# Patient Record
Sex: Female | Born: 1985 | Race: Black or African American | Hispanic: No | Marital: Single | Smoking: Former smoker
Health system: Southern US, Community
[De-identification: ages and names within clinical notes are randomized; demographics above are authoritative.]

## PROBLEM LIST (undated history)

## (undated) DIAGNOSIS — F32A Depression, unspecified: Secondary | ICD-10-CM

## (undated) DIAGNOSIS — F419 Anxiety disorder, unspecified: Secondary | ICD-10-CM

## (undated) DIAGNOSIS — D649 Anemia, unspecified: Secondary | ICD-10-CM

## (undated) DIAGNOSIS — Z973 Presence of spectacles and contact lenses: Secondary | ICD-10-CM

## (undated) DIAGNOSIS — K219 Gastro-esophageal reflux disease without esophagitis: Secondary | ICD-10-CM

## (undated) DIAGNOSIS — R430 Anosmia: Secondary | ICD-10-CM

## (undated) HISTORY — DX: Anosmia: R43.0

---

## 2013-05-20 ENCOUNTER — Emergency Department (HOSPITAL_BASED_OUTPATIENT_CLINIC_OR_DEPARTMENT_OTHER): Payer: 59

## 2013-05-20 ENCOUNTER — Encounter (HOSPITAL_BASED_OUTPATIENT_CLINIC_OR_DEPARTMENT_OTHER): Payer: Self-pay | Admitting: Emergency Medicine

## 2013-05-20 ENCOUNTER — Emergency Department (HOSPITAL_BASED_OUTPATIENT_CLINIC_OR_DEPARTMENT_OTHER)
Admission: EM | Admit: 2013-05-20 | Discharge: 2013-05-20 | Disposition: A | Discharge status: Home / Self Care | Payer: 59 | Attending: Emergency Medicine | Admitting: Emergency Medicine

## 2013-05-20 DIAGNOSIS — R102 Pelvic and perineal pain: Secondary | ICD-10-CM

## 2013-05-20 DIAGNOSIS — Z3202 Encounter for pregnancy test, result negative: Secondary | ICD-10-CM | POA: Insufficient documentation

## 2013-05-20 DIAGNOSIS — R109 Unspecified abdominal pain: Secondary | ICD-10-CM | POA: Insufficient documentation

## 2013-05-20 DIAGNOSIS — R110 Nausea: Secondary | ICD-10-CM | POA: Insufficient documentation

## 2013-05-20 LAB — URINALYSIS, ROUTINE W REFLEX MICROSCOPIC
BILIRUBIN URINE: NEGATIVE
Glucose, UA: NEGATIVE mg/dL
HGB URINE DIPSTICK: NEGATIVE
Ketones, ur: 15 mg/dL — AB
Leukocytes, UA: NEGATIVE
Nitrite: NEGATIVE
PH: 7.5 (ref 5.0–8.0)
Protein, ur: NEGATIVE mg/dL
Specific Gravity, Urine: 1.016 (ref 1.005–1.030)
UROBILINOGEN UA: 0.2 mg/dL (ref 0.0–1.0)

## 2013-05-20 LAB — COMPREHENSIVE METABOLIC PANEL
ALT: 8 U/L (ref 0–35)
AST: 14 U/L (ref 0–37)
Albumin: 4.1 g/dL (ref 3.5–5.2)
Alkaline Phosphatase: 57 U/L (ref 39–117)
BILIRUBIN TOTAL: 0.4 mg/dL (ref 0.3–1.2)
BUN: 8 mg/dL (ref 6–23)
CHLORIDE: 99 meq/L (ref 96–112)
CO2: 29 mEq/L (ref 19–32)
Calcium: 9.8 mg/dL (ref 8.4–10.5)
Creatinine, Ser: 0.8 mg/dL (ref 0.50–1.10)
Glucose, Bld: 95 mg/dL (ref 70–99)
Potassium: 4 mEq/L (ref 3.7–5.3)
SODIUM: 138 meq/L (ref 137–147)
Total Protein: 7.6 g/dL (ref 6.0–8.3)

## 2013-05-20 LAB — CBC WITH DIFFERENTIAL/PLATELET
BASOS ABS: 0 10*3/uL (ref 0.0–0.1)
Basophils Relative: 0 % (ref 0–1)
Eosinophils Absolute: 0.4 10*3/uL (ref 0.0–0.7)
Eosinophils Relative: 4 % (ref 0–5)
HEMATOCRIT: 41.7 % (ref 36.0–46.0)
Hemoglobin: 13.4 g/dL (ref 12.0–15.0)
LYMPHS PCT: 33 % (ref 12–46)
Lymphs Abs: 3.4 10*3/uL (ref 0.7–4.0)
MCH: 25.4 pg — ABNORMAL LOW (ref 26.0–34.0)
MCHC: 32.1 g/dL (ref 30.0–36.0)
MCV: 79.1 fL (ref 78.0–100.0)
MONO ABS: 0.7 10*3/uL (ref 0.1–1.0)
Monocytes Relative: 7 % (ref 3–12)
NEUTROS ABS: 5.7 10*3/uL (ref 1.7–7.7)
Neutrophils Relative %: 56 % (ref 43–77)
PLATELETS: 263 10*3/uL (ref 150–400)
RBC: 5.27 MIL/uL — ABNORMAL HIGH (ref 3.87–5.11)
RDW: 13.6 % (ref 11.5–15.5)
WBC: 10.2 10*3/uL (ref 4.0–10.5)

## 2013-05-20 LAB — PREGNANCY, URINE: PREG TEST UR: NEGATIVE

## 2013-05-20 LAB — WET PREP, GENITAL
Trich, Wet Prep: NONE SEEN
Yeast Wet Prep HPF POC: NONE SEEN

## 2013-05-20 MED ORDER — KETOROLAC TROMETHAMINE 30 MG/ML IJ SOLN
30.0000 mg | Freq: Once | INTRAMUSCULAR | Status: AC
  Administered 2013-05-20: 30 mg via INTRAVENOUS
  Filled 2013-05-20: qty 1

## 2013-05-20 MED ORDER — HYDROCODONE-ACETAMINOPHEN 5-325 MG PO TABS: 1.0000 | ORAL_TABLET | ORAL | Status: DC | PRN

## 2013-05-20 NOTE — ED Notes (Signed)
Pt c/o generalized abd pain with nausea only

## 2013-05-20 NOTE — Discharge Instructions (Signed)
Hydrocodone as prescribed as needed for pain.  Return to the emergency department if your symptoms worsen, or you develop vaginal bleeding, high fever, bloody stool, or any other new or concerning symptoms.   Abdominal Pain, Women Abdominal (stomach, pelvic, or belly) pain can be caused by many things. It is important to tell your doctor:  The location of the pain.  Does it come and go or is it present all the time?  Are there things that start the pain (eating certain foods, exercise)?  Are there other symptoms associated with the pain (fever, nausea, vomiting, diarrhea)? All of this is helpful to know when trying to find the cause of the pain. CAUSES   Stomach: virus or bacteria infection, or ulcer.  Intestine: appendicitis (inflamed appendix), regional ileitis (Crohn's disease), ulcerative colitis (inflamed colon), irritable bowel syndrome, diverticulitis (inflamed diverticulum of the colon), or cancer of the stomach or intestine.  Gallbladder disease or stones in the gallbladder.  Kidney disease, kidney stones, or infection.  Pancreas infection or cancer.  Fibromyalgia (pain disorder).  Diseases of the female organs:  Uterus: fibroid (non-cancerous) tumors or infection.  Fallopian tubes: infection or tubal pregnancy.  Ovary: cysts or tumors.  Pelvic adhesions (scar tissue).  Endometriosis (uterus lining tissue growing in the pelvis and on the pelvic organs).  Pelvic congestion syndrome (female organs filling up with blood just before the menstrual period).  Pain with the menstrual period.  Pain with ovulation (producing an egg).  Pain with an IUD (intrauterine device, birth control) in the uterus.  Cancer of the female organs.  Functional pain (pain not caused by a disease, may improve without treatment).  Psychological pain.  Depression. DIAGNOSIS  Your doctor will decide the seriousness of your pain by doing an examination.  Blood  tests.  X-rays.  Ultrasound.  CT scan (computed tomography, special type of X-ray).  MRI (magnetic resonance imaging).  Cultures, for infection.  Barium enema (dye inserted in the large intestine, to better view it with X-rays).  Colonoscopy (looking in intestine with a lighted tube).  Laparoscopy (minor surgery, looking in abdomen with a lighted tube).  Major abdominal exploratory surgery (looking in abdomen with a large incision). TREATMENT  The treatment will depend on the cause of the pain.   Many cases can be observed and treated at home.  Over-the-counter medicines recommended by your caregiver.  Prescription medicine.  Antibiotics, for infection.  Birth control pills, for painful periods or for ovulation pain.  Hormone treatment, for endometriosis.  Nerve blocking injections.  Physical therapy.  Antidepressants.  Counseling with a psychologist or psychiatrist.  Minor or major surgery. HOME CARE INSTRUCTIONS   Do not take laxatives, unless directed by your caregiver.  Take over-the-counter pain medicine only if ordered by your caregiver. Do not take aspirin because it can cause an upset stomach or bleeding.  Try a clear liquid diet (broth or water) as ordered by your caregiver. Slowly move to a bland diet, as tolerated, if the pain is related to the stomach or intestine.  Have a thermometer and take your temperature several times a day, and record it.  Bed rest and sleep, if it helps the pain.  Avoid sexual intercourse, if it causes pain.  Avoid stressful situations.  Keep your follow-up appointments and tests, as your caregiver orders.  If the pain does not go away with medicine or surgery, you may try:  Acupuncture.  Relaxation exercises (yoga, meditation).  Group therapy.  Counseling. SEEK MEDICAL CARE IF:  You notice certain foods cause stomach pain.  Your home care treatment is not helping your pain.  You need stronger pain  medicine.  You want your IUD removed.  You feel faint or lightheaded.  You develop nausea and vomiting.  You develop a rash.  You are having side effects or an allergy to your medicine. SEEK IMMEDIATE MEDICAL CARE IF:   Your pain does not go away or gets worse.  You have a fever.  Your pain is felt only in portions of the abdomen. The right side could possibly be appendicitis. The left lower portion of the abdomen could be colitis or diverticulitis.  You are passing blood in your stools (bright red or black tarry stools, with or without vomiting).  You have blood in your urine.  You develop chills, with or without a fever.  You pass out. MAKE SURE YOU:   Understand these instructions.  Will watch your condition.  Will get help right away if you are not doing well or get worse. Document Released: 11/05/2006 Document Revised: 04/02/2011 Document Reviewed: 11/25/2008 [facility] Patient Information 2014 [place], [place].

## 2013-05-20 NOTE — ED Provider Notes (Signed)
CSN: [id number]     Arrival date & time 05/20/13  1600 History   First MD Initiated Contact with Patient 05/20/13 1614     Chief Complaint  Patient presents with  . Abdominal Pain     (Consider location/radiation/quality/duration/timing/severity/associated sxs/prior Treatment) HPI Comments: Patient is a 28 year old female who presents with complaints of suprapubic abdominal pain and nausea for the past 2 days. She denies any bowel or bladder complaints. She denies any vaginal bleeding or discharge. She denies any fevers or chills. Her discomfort has been constant and is worse with palpation and movement.  Patient is a 28 y.o. female presenting with abdominal pain. The history is provided by the patient.  Abdominal Pain Pain location:  Suprapubic Pain quality: cramping   Pain radiates to:  Does not radiate Pain severity:  Moderate Onset quality:  Gradual Duration:  2 days Timing:  Constant Progression:  Worsening Chronicity:  New Relieved by:  Nothing Worsened by:  Nothing tried Ineffective treatments:  None tried   History reviewed. No pertinent past medical history. History reviewed. No pertinent past surgical history. History reviewed. No pertinent family history. History  Substance Use Topics  . Smoking status: Never Smoker   . Smokeless tobacco: Not on file  . Alcohol Use: No   OB History   Grav Para Term Preterm Abortions TAB SAB Ect Mult Living                 Review of Systems  Gastrointestinal: Positive for abdominal pain.  All other systems reviewed and are negative.     Allergies  Review of patient's allergies indicates no known allergies.  Home Medications   Prior to Admission medications   Medication Sig Start Date End Date Taking? Authorizing Provider  ibuprofen (ADVIL,MOTRIN) 800 MG tablet Take 800 mg by mouth every 8 (eight) hours as needed.   Yes Historical Provider, MD   BP 134/82  Pulse 83  Temp(Src) 97.8 F (36.6 C) (Oral)  Resp 16  Ht  5\' 4"  (1.626 m)  Wt 165 lb (74.844 kg)  BMI 28.31 kg/m2  SpO2 100%  LMP 04/20/2013 Physical Exam  Nursing note and vitals reviewed. Constitutional: She is oriented to person, place, and time. She appears well-developed and well-nourished. No distress.  HENT:  Head: Normocephalic and atraumatic.  Neck: Normal range of motion. Neck supple.  Cardiovascular: Normal rate and regular rhythm.  Exam reveals no gallop and no friction rub.   No murmur heard. Pulmonary/Chest: Effort normal and breath sounds normal. No respiratory distress. She has no wheezes.  Abdominal: Soft. Bowel sounds are normal. She exhibits no distension. There is tenderness.  There is tenderness to palpation in the suprapubic region. There is no rebound and no guarding.  Musculoskeletal: Normal range of motion.  Neurological: She is alert and oriented to person, place, and time.  Skin: Skin is warm and dry. She is not diaphoretic.    ED Course  Procedures (including critical care time) Labs Review Labs Reviewed  URINALYSIS, ROUTINE W REFLEX MICROSCOPIC  PREGNANCY, URINE    Imaging Review No results found.   EKG Interpretation None      MDM   Final diagnoses:  None    Patient is a 28 year old female who presents with complaints of suprapubic discomfort. This is been going on for the past 12 hours. She denies any injury or trauma. She is having no bowel or bladder complaints and no fever. She denies any vaginal bleeding or discharge. Workup today reveals no  elevation of white count and there is no right lower quadrant tenderness to palpation I strongly doubt appendicitis. Her pregnancy test is negative, thus ruling out an ectopic pregnancy. Pelvic examination is essentially unremarkable and wet prep reveals no evidence for infection. This was followed up with an ultrasound of the pelvis which revealed no acute pelvic abnormality. There is no evidence for ovarian cyst or torsion. At this point, I feel as though  I've excluded any emergent pathology and feel as though symptomatic treatment and time are the appropriate treatment modality. She understands to return if she develops any problems.    Veryl Speak, MD 05/20/13 2108

## 2013-05-21 LAB — GC/CHLAMYDIA PROBE AMP
CT Probe RNA: NEGATIVE
GC PROBE AMP APTIMA: NEGATIVE

## 2013-05-21 LAB — HIV ANTIBODY (ROUTINE TESTING W REFLEX): HIV 1&2 Ab, 4th Generation: NONREACTIVE

## 2013-06-01 ENCOUNTER — Emergency Department (HOSPITAL_BASED_OUTPATIENT_CLINIC_OR_DEPARTMENT_OTHER): Payer: Medicaid - Out of State

## 2013-06-01 ENCOUNTER — Emergency Department (HOSPITAL_BASED_OUTPATIENT_CLINIC_OR_DEPARTMENT_OTHER)
Admission: EM | Admit: 2013-06-01 | Discharge: 2013-06-01 | Disposition: A | Discharge status: Home / Self Care | Payer: Medicaid - Out of State | Attending: Emergency Medicine | Admitting: Emergency Medicine

## 2013-06-01 ENCOUNTER — Encounter (HOSPITAL_BASED_OUTPATIENT_CLINIC_OR_DEPARTMENT_OTHER): Payer: Self-pay | Admitting: Emergency Medicine

## 2013-06-01 DIAGNOSIS — M546 Pain in thoracic spine: Secondary | ICD-10-CM | POA: Insufficient documentation

## 2013-06-01 DIAGNOSIS — M549 Dorsalgia, unspecified: Secondary | ICD-10-CM

## 2013-06-01 DIAGNOSIS — R300 Dysuria: Secondary | ICD-10-CM | POA: Insufficient documentation

## 2013-06-01 MED ORDER — KETOROLAC TROMETHAMINE 60 MG/2ML IM SOLN
60.0000 mg | Freq: Once | INTRAMUSCULAR | Status: AC
  Administered 2013-06-01: 60 mg via INTRAMUSCULAR
  Filled 2013-06-01: qty 2

## 2013-06-01 MED ORDER — HYDROMORPHONE HCL PF 2 MG/ML IJ SOLN
2.0000 mg | Freq: Once | INTRAMUSCULAR | Status: AC
  Administered 2013-06-01: 2 mg via INTRAMUSCULAR
  Filled 2013-06-01: qty 1

## 2013-06-01 MED ORDER — IBUPROFEN 800 MG PO TABS: 800.0000 mg | ORAL_TABLET | Freq: Three times a day (TID) | ORAL | Status: DC | PRN

## 2013-06-01 MED ORDER — HYDROCODONE-ACETAMINOPHEN 5-325 MG PO TABS: 1.0000 | ORAL_TABLET | ORAL | Status: DC | PRN

## 2013-06-01 NOTE — Discharge Instructions (Signed)
Back Pain, Adult Low back pain is very common. About 1 in 5 people have back pain.The cause of low back pain is rarely dangerous. The pain often gets better over time.About half of people with a sudden onset of back pain feel better in just 2 weeks. About 8 in 10 people feel better by 6 weeks.  CAUSES Some common causes of back pain include:  Strain of the muscles or ligaments supporting the spine.  Wear and tear (degeneration) of the spinal discs.  Arthritis.  Direct injury to the back. DIAGNOSIS Most of the time, the direct cause of low back pain is not known.However, back pain can be treated effectively even when the exact cause of the pain is unknown.Answering your caregiver's questions about your overall health and symptoms is one of the most accurate ways to make sure the cause of your pain is not dangerous. If your caregiver needs more information, he or she may order lab work or imaging tests (X-rays or MRIs).However, even if imaging tests show changes in your back, this usually does not require surgery. HOME CARE INSTRUCTIONS For many people, back pain returns.Since low back pain is rarely dangerous, it is often a condition that people can learn to manageon their own.   Remain active. It is stressful on the back to sit or stand in one place. Do not sit, drive, or stand in one place for more than 30 minutes at a time. Take short walks on level surfaces as soon as pain allows.Try to increase the length of time you walk each day.  Do not stay in bed.Resting more than 1 or 2 days can delay your recovery.  Do not avoid exercise or work.Your body is made to move.It is not dangerous to be active, even though your back may hurt.Your back will likely heal faster if you return to being active before your pain is gone.  Pay attention to your body when you bend and lift. Many people have less discomfortwhen lifting if they bend their knees, keep the load close to their bodies,and  avoid twisting. Often, the most comfortable positions are those that put less stress on your recovering back.  Find a comfortable position to sleep. Use a firm mattress and lie on your side with your knees slightly bent. If you lie on your back, put a pillow under your knees.  Only take over-the-counter or prescription medicines as directed by your caregiver. Over-the-counter medicines to reduce pain and inflammation are often the most helpful.Your caregiver may prescribe muscle relaxant drugs.These medicines help dull your pain so you can more quickly return to your normal activities and healthy exercise.  Put ice on the injured area.  Put ice in a plastic bag.  Place a towel between your skin and the bag.  Leave the ice on for 15-20 minutes, 03-04 times a day for the first 2 to 3 days. After that, ice and heat may be alternated to reduce pain and spasms.  Ask your caregiver about trying back exercises and gentle massage. This may be of some benefit.  Avoid feeling anxious or stressed.Stress increases muscle tension and can worsen back pain.It is important to recognize when you are anxious or stressed and learn ways to manage it.Exercise is a great option. SEEK MEDICAL CARE IF:  You have pain that is not relieved with rest or medicine.  You have pain that does not improve in 1 week.  You have new symptoms.  You are generally not feeling well. SEEK   IMMEDIATE MEDICAL CARE IF:   You have pain that radiates from your back into your legs.  You develop new bowel or bladder control problems.  You have unusual weakness or numbness in your arms or legs.  You develop nausea or vomiting.  You develop abdominal pain.  You feel faint. Document Released: 01/08/2005 Document Revised: 07/10/2011 Document Reviewed: 05/29/2010 [employer] Patient Information 2014 [employer].  

## 2013-06-01 NOTE — ED Provider Notes (Signed)
Medical screening examination/treatment/procedure(s) were performed by non-physician practitioner and as supervising physician I was immediately available for consultation/collaboration.   EKG Interpretation None        Blanchie Dessert, MD 06/01/13 [phone]

## 2013-06-01 NOTE — ED Provider Notes (Signed)
CSN: [id number]     Arrival date & time 06/01/13  1153 History   First MD Initiated Contact with Patient 06/01/13 1312     Chief Complaint  Patient presents with  . Back Pain     (Consider location/radiation/quality/duration/timing/severity/associated sxs/prior Treatment) Patient is a 28 y.o. female presenting with back pain. The history is provided by the patient. No language interpreter was used.  Back Pain Location:  Thoracic spine Quality:  Aching Radiates to:  Does not radiate Pain severity:  Moderate Pain is:  Same all the time Onset quality:  Gradual Duration:  1 day Timing:  Constant Progression:  Worsening Chronicity:  New Context: not recent illness and not recent injury   Relieved by:  Nothing Worsened by:  Nothing tried Ineffective treatments:  None tried Associated symptoms: dysuria   Associated symptoms: no fever, no headaches and no numbness   Risk factors: not pregnant and no recent surgery     History reviewed. No pertinent past medical history. History reviewed. No pertinent past surgical history. History reviewed. No pertinent family history. History  Substance Use Topics  . Smoking status: Never Smoker   . Smokeless tobacco: Not on file  . Alcohol Use: No   OB History   Grav Para Term Preterm Abortions TAB SAB Ect Mult Living                 Review of Systems  Constitutional: Negative for fever.  Genitourinary: Positive for dysuria.  Musculoskeletal: Positive for back pain.  Neurological: Negative for numbness and headaches.  All other systems reviewed and are negative.     Allergies  Review of patient's allergies indicates no known allergies.  Home Medications   Prior to Admission medications   Medication Sig Start Date End Date Taking? Authorizing Provider  HYDROcodone-acetaminophen (NORCO) 5-325 MG per tablet Take 1-2 tablets by mouth every 4 (four) hours as needed. 05/20/13   Veryl Speak, MD  ibuprofen (ADVIL,MOTRIN) 800 MG tablet  Take 800 mg by mouth every 8 (eight) hours as needed.    Historical Provider, MD   BP 125/89  Pulse 112  Temp(Src) 98.8 F (37.1 C) (Oral)  Resp 16  Wt 165 lb (74.844 kg)  SpO2 100%  LMP 04/20/2013 Physical Exam  Nursing note and vitals reviewed. Constitutional: She is oriented to person, place, and time. She appears well-developed and well-nourished.  HENT:  Head: Normocephalic and atraumatic.  Eyes: Conjunctivae and EOM are normal. Pupils are equal, round, and reactive to light.  Neck: Normal range of motion. Neck supple.  Cardiovascular: Normal rate.   Pulmonary/Chest: Effort normal.  Abdominal: Soft.  Musculoskeletal: Normal range of motion.  Neurological: She is alert and oriented to person, place, and time. She has normal reflexes.  Skin: Skin is warm.  Psychiatric: She has a normal mood and affect.    ED Course  Procedures (including critical care time) Labs Review Labs Reviewed - No data to display  Imaging Review Dg Chest 2 View  06/01/2013   CLINICAL DATA:  Back pain.  EXAM: CHEST  2 VIEW  COMPARISON:  None.  FINDINGS: The heart size and mediastinal contours are within normal limits. Both lungs are clear. No pneumothorax or pleural effusion is noted. The visualized skeletal structures are unremarkable.  IMPRESSION: No acute cardiopulmonary abnormality seen.   Electronically Signed   By: Sabino Dick M.D.   On: 06/01/2013 13:10     EKG Interpretation None      MDM   Final diagnoses:  None    Hydrocodone Ibuprofen Schedule to see Dr. Barbaraann Barthel for recheck    Fransico Meadow, PA-C 06/01/13 1404

## 2013-06-01 NOTE — ED Notes (Signed)
Middle back pain since yesterday afternoon. Denies injury. States now she is feeling some SOB.

## 2014-03-28 ENCOUNTER — Other Ambulatory Visit: Payer: Self-pay | Admitting: Emergency Medicine

## 2014-03-28 ENCOUNTER — Emergency Department (HOSPITAL_BASED_OUTPATIENT_CLINIC_OR_DEPARTMENT_OTHER)
Admission: EM | Admit: 2014-03-28 | Discharge: 2014-03-28 | Disposition: A | Discharge status: Home / Self Care | Payer: Medicaid - Out of State | Attending: Emergency Medicine | Admitting: Emergency Medicine

## 2014-03-28 ENCOUNTER — Encounter (HOSPITAL_BASED_OUTPATIENT_CLINIC_OR_DEPARTMENT_OTHER): Payer: Self-pay

## 2014-03-28 DIAGNOSIS — J029 Acute pharyngitis, unspecified: Secondary | ICD-10-CM

## 2014-03-28 LAB — RAPID STREP SCREEN (MED CTR MEBANE ONLY): STREPTOCOCCUS, GROUP A SCREEN (DIRECT): NEGATIVE

## 2014-03-28 NOTE — Discharge Instructions (Signed)
Ibuprofen 600 mg every 6 hours as needed for pain.  We will call you if your cultures indicate you require further treatment.  Return to the ER if your symptoms substantially worsen or change.   Viral Infections A viral infection can be caused by different types of viruses.Most viral infections are not serious and resolve on their own. However, some infections may cause severe symptoms and may lead to further complications. SYMPTOMS Viruses can frequently cause:  Minor sore throat.  Aches and pains.  Headaches.  Runny nose.  Different types of rashes.  Watery eyes.  Tiredness.  Cough.  Loss of appetite.  Gastrointestinal infections, resulting in nausea, vomiting, and diarrhea. These symptoms do not respond to antibiotics because the infection is not caused by bacteria. However, you might catch a bacterial infection following the viral infection. This is sometimes called a "superinfection." Symptoms of such a bacterial infection may include:  Worsening sore throat with pus and difficulty swallowing.  Swollen neck glands.  Chills and a high or persistent fever.  Severe headache.  Tenderness over the sinuses.  Persistent overall ill feeling (malaise), muscle aches, and tiredness (fatigue).  Persistent cough.  Yellow, green, or brown mucus production with coughing. HOME CARE INSTRUCTIONS   Only take over-the-counter or prescription medicines for pain, discomfort, diarrhea, or fever as directed by your caregiver.  Drink enough water and fluids to keep your urine clear or pale yellow. Sports drinks can provide valuable electrolytes, sugars, and hydration.  Get plenty of rest and maintain proper nutrition. Soups and broths with crackers or rice are fine. SEEK IMMEDIATE MEDICAL CARE IF:   You have severe headaches, shortness of breath, chest pain, neck pain, or an unusual rash.  You have uncontrolled vomiting, diarrhea, or you are unable to keep down fluids.  You  or your child has an oral temperature above 102 F (38.9 C), not controlled by medicine.  Your baby is older than 3 months with a rectal temperature of 102 F (38.9 C) or higher.  Your baby is 19 months old or younger with a rectal temperature of 100.4 F (38 C) or higher. MAKE SURE YOU:   Understand these instructions.  Will watch your condition.  Will get help right away if you are not doing well or get worse. Document Released: 10/18/2004 Document Revised: 04/02/2011 Document Reviewed: 05/15/2010 [facility] Patient Information 2015 [place], [place]. This information is not intended to replace advice given to you by your health care provider. Make sure you discuss any questions you have with your health care provider.

## 2014-03-28 NOTE — ED Notes (Signed)
Patient here with sore throat and fever x 2 days.

## 2014-03-29 NOTE — ED Provider Notes (Signed)
CSN: [id number]     Arrival date & time 03/28/14  1028 History   First MD Initiated Contact with Patient 03/28/14 1104     Chief Complaint  Patient presents with  . Sore Throat     (Consider location/radiation/quality/duration/timing/severity/associated sxs/prior Treatment) HPI Comments: Patient presents with a 2 day history of sore throat. She denies fevers. She denies difficulty breathing or swallowing.  Patient is a 29 y.o. female presenting with pharyngitis. The history is provided by the patient.  Sore Throat This is a new problem. The current episode started 2 days ago. The problem occurs constantly. The problem has been gradually worsening. Pertinent negatives include no chest pain and no abdominal pain. The symptoms are aggravated by swallowing and eating. Nothing relieves the symptoms. She has tried nothing for the symptoms. The treatment provided no relief.    History reviewed. No pertinent past medical history. History reviewed. No pertinent past surgical history. No family history on file. History  Substance Use Topics  . Smoking status: Never Smoker   . Smokeless tobacco: Not on file  . Alcohol Use: No   OB History    No data available     Review of Systems  Cardiovascular: Negative for chest pain.  Gastrointestinal: Negative for abdominal pain.  All other systems reviewed and are negative.     Allergies  Review of patient's allergies indicates no known allergies.  Home Medications   Prior to Admission medications   Medication Sig Start Date End Date Taking? Authorizing Provider  ibuprofen (ADVIL,MOTRIN) 800 MG tablet Take 1 tablet (800 mg total) by mouth every 8 (eight) hours as needed. 06/01/13   Fransico Meadow, PA-C   BP 137/89 mmHg  Pulse 90  Temp(Src) 98.2 F (36.8 C) (Oral)  Resp 18  Ht 5\' 5"  (1.651 m)  Wt 157 lb (71.215 kg)  BMI 26.13 kg/m2  SpO2 100%  LMP 03/23/2014 Physical Exam  Constitutional: She is oriented to person, place, and time.  She appears well-developed and well-nourished. No distress.  HENT:  Head: Normocephalic and atraumatic.  Mouth/Throat: Oropharynx is clear and moist.  There is mild erythema of the posterior oropharynx with no exudates present.  Neck: Normal range of motion. Neck supple.  Cardiovascular: Normal rate and regular rhythm.  Exam reveals no gallop and no friction rub.   No murmur heard. Pulmonary/Chest: Effort normal and breath sounds normal. No respiratory distress. She has no wheezes.  Abdominal: Soft. Bowel sounds are normal. She exhibits no distension. There is no tenderness.  Musculoskeletal: Normal range of motion.  Lymphadenopathy:    She has no cervical adenopathy.  Neurological: She is alert and oriented to person, place, and time.  Skin: Skin is warm and dry. She is not diaphoretic.  Nursing note and vitals reviewed.   ED Course  Procedures (including critical care time) Labs Review Labs Reviewed  RAPID STREP SCREEN    Imaging Review No results found.   EKG Interpretation None      MDM   Final diagnoses:  Viral pharyngitis    Strep test negative. Symptoms likely viral in nature will recommend ibuprofen and when necessary return.    Veryl Speak, MD 03/29/14 [phone]

## 2014-06-15 ENCOUNTER — Emergency Department (HOSPITAL_COMMUNITY)
Admission: EM | Admit: 2014-06-15 | Discharge: 2014-06-15 | Disposition: A | Discharge status: Home / Self Care | Payer: Medicaid - Out of State | Attending: Emergency Medicine | Admitting: Emergency Medicine

## 2014-06-15 ENCOUNTER — Encounter (HOSPITAL_COMMUNITY): Payer: Self-pay | Admitting: Cardiology

## 2014-06-15 DIAGNOSIS — R112 Nausea with vomiting, unspecified: Secondary | ICD-10-CM | POA: Diagnosis not present

## 2014-06-15 DIAGNOSIS — R197 Diarrhea, unspecified: Secondary | ICD-10-CM | POA: Insufficient documentation

## 2014-06-15 DIAGNOSIS — R109 Unspecified abdominal pain: Secondary | ICD-10-CM | POA: Diagnosis not present

## 2014-06-15 DIAGNOSIS — Z3202 Encounter for pregnancy test, result negative: Secondary | ICD-10-CM | POA: Insufficient documentation

## 2014-06-15 LAB — CBC WITH DIFFERENTIAL/PLATELET
Basophils Absolute: 0 10*3/uL (ref 0.0–0.1)
Basophils Relative: 0 % (ref 0–1)
Eosinophils Absolute: 0.2 10*3/uL (ref 0.0–0.7)
Eosinophils Relative: 2 % (ref 0–5)
HCT: 42.4 % (ref 36.0–46.0)
Hemoglobin: 13.7 g/dL (ref 12.0–15.0)
LYMPHS PCT: 17 % (ref 12–46)
Lymphs Abs: 1.8 10*3/uL (ref 0.7–4.0)
MCH: 25 pg — ABNORMAL LOW (ref 26.0–34.0)
MCHC: 32.3 g/dL (ref 30.0–36.0)
MCV: 77.4 fL — ABNORMAL LOW (ref 78.0–100.0)
MONOS PCT: 5 % (ref 3–12)
Monocytes Absolute: 0.5 10*3/uL (ref 0.1–1.0)
NEUTROS PCT: 76 % (ref 43–77)
Neutro Abs: 8 10*3/uL — ABNORMAL HIGH (ref 1.7–7.7)
Platelets: 227 10*3/uL (ref 150–400)
RBC: 5.48 MIL/uL — ABNORMAL HIGH (ref 3.87–5.11)
RDW: 13.6 % (ref 11.5–15.5)
WBC: 10.4 10*3/uL (ref 4.0–10.5)

## 2014-06-15 LAB — COMPREHENSIVE METABOLIC PANEL
ALBUMIN: 3.9 g/dL (ref 3.5–5.0)
ALT: 17 U/L (ref 14–54)
AST: 34 U/L (ref 15–41)
Alkaline Phosphatase: 52 U/L (ref 38–126)
Anion gap: 8 (ref 5–15)
BUN: 11 mg/dL (ref 6–20)
CHLORIDE: 103 mmol/L (ref 101–111)
CO2: 26 mmol/L (ref 22–32)
Calcium: 9.1 mg/dL (ref 8.9–10.3)
Creatinine, Ser: 0.76 mg/dL (ref 0.44–1.00)
GFR calc Af Amer: >60 mL/min (ref >60)
GFR calc non Af Amer: >60 mL/min (ref >60)
Glucose, Bld: 99 mg/dL (ref 65–99)
Potassium: 3.8 mmol/L (ref 3.5–5.1)
SODIUM: 137 mmol/L (ref 135–145)
Total Bilirubin: 0.9 mg/dL (ref 0.3–1.2)
Total Protein: 7.6 g/dL (ref 6.5–8.1)

## 2014-06-15 LAB — URINALYSIS, ROUTINE W REFLEX MICROSCOPIC
BILIRUBIN URINE: NEGATIVE
Glucose, UA: NEGATIVE mg/dL
Hgb urine dipstick: NEGATIVE
Ketones, ur: NEGATIVE mg/dL
Leukocytes, UA: NEGATIVE
Nitrite: NEGATIVE
PROTEIN: NEGATIVE mg/dL
Specific Gravity, Urine: 1.025 (ref 1.005–1.030)
Urobilinogen, UA: 0.2 mg/dL (ref 0.0–1.0)
pH: 5 (ref 5.0–8.0)

## 2014-06-15 LAB — POC URINE PREG, ED: Preg Test, Ur: NEGATIVE

## 2014-06-15 MED ORDER — ACETAMINOPHEN 500 MG PO TABS
500.0000 mg | ORAL_TABLET | Freq: Once | ORAL | Status: AC
  Administered 2014-06-15: 500 mg via ORAL
  Filled 2014-06-15: qty 1

## 2014-06-15 MED ORDER — DICYCLOMINE HCL 10 MG PO CAPS
10.0000 mg | ORAL_CAPSULE | Freq: Once | ORAL | Status: AC
  Administered 2014-06-15: 10 mg via ORAL
  Filled 2014-06-15: qty 1

## 2014-06-15 MED ORDER — SODIUM CHLORIDE 0.9 % IV BOLUS (SEPSIS)
1000.0000 mL | Freq: Once | INTRAVENOUS | Status: AC
  Administered 2014-06-15: 1000 mL via INTRAVENOUS

## 2014-06-15 MED ORDER — DICYCLOMINE HCL 20 MG PO TABS: 20.0000 mg | ORAL_TABLET | Freq: Two times a day (BID) | ORAL | Status: DC

## 2014-06-15 MED ORDER — ONDANSETRON HCL 4 MG/2ML IJ SOLN
4.0000 mg | Freq: Once | INTRAMUSCULAR | Status: AC
  Administered 2014-06-15: 4 mg via INTRAVENOUS
  Filled 2014-06-15: qty 2

## 2014-06-15 MED ORDER — ONDANSETRON 4 MG PO TBDP: 4.0000 mg | ORAL_TABLET | Freq: Three times a day (TID) | ORAL | Status: DC | PRN

## 2014-06-15 NOTE — ED Notes (Signed)
Pt reports that she thinks that she may have food poisoning. Reports n/v since yesterday. Also having diarrhea.

## 2014-06-15 NOTE — ED Provider Notes (Signed)
CSN: [id number]     Arrival date & time 06/15/14  4818 History   First MD Initiated Contact with Patient 06/15/14 1233     Chief Complaint  Patient presents with  . Abdominal Pain  . Emesis   Wanda Nunez is a 29 y.o. female who is otherwise healthy who presents to the ED complaining of nausea, vomiting and diarrhea since yesterday. The patient reports that she ate a deli sandwich last night and both her and her son are sick with vomiting, diarrhea and nausea. Patient reports she has vomited 4 times today. She also reports 6 episodes of diarrhea. She denies hematemesis or hematochezia. She reports lots of belching. She complains of 6 out of 10 abdominal cramping diffusely that began after the vomiting and diarrhea. She reports she last vomited at 10 AM this morning. Patient denies history of previous abdominal surgeries. She reports her last menstrual cycle was 05/09/2014 and she has irregular menstrual cycles. The patient denies fevers, chills, urinary symptoms, hematuria, vaginal bleeding, vaginal discharge, hematemesis, hematochezia, lightheadedness, dizziness, weakness, or rashes.  (Consider location/radiation/quality/duration/timing/severity/associated sxs/prior Treatment) HPI  History reviewed. No pertinent past medical history. History reviewed. No pertinent past surgical history. History reviewed. No pertinent family history. History  Substance Use Topics  . Smoking status: Never Smoker   . Smokeless tobacco: Not on file  . Alcohol Use: No   OB History    No data available     Review of Systems  Constitutional: Negative for fever and chills.  HENT: Negative for congestion, ear pain and sore throat.   Eyes: Negative for pain and visual disturbance.  Respiratory: Negative for cough and shortness of breath.   Cardiovascular: Negative for chest pain.  Gastrointestinal: Positive for nausea, vomiting, abdominal pain and diarrhea. Negative for blood in stool.  Genitourinary:  Negative for dysuria, urgency, frequency, hematuria, flank pain, vaginal bleeding, vaginal discharge and difficulty urinating.  Musculoskeletal: Negative for back pain and neck pain.  Skin: Negative for rash.  Neurological: Negative for weakness, light-headedness and headaches.      Allergies  Review of patient's allergies indicates no known allergies.  Home Medications   Prior to Admission medications   Medication Sig Start Date End Date Taking? Authorizing Provider  dicyclomine (BENTYL) 20 MG tablet Take 1 tablet (20 mg total) by mouth 2 (two) times daily. 06/15/14   Waynetta Pean, PA-C  ondansetron (ZOFRAN ODT) 4 MG disintegrating tablet Take 1 tablet (4 mg total) by mouth every 8 (eight) hours as needed for nausea or vomiting. 06/15/14   Waynetta Pean, PA-C   BP 97/66 mmHg  Pulse 73  Temp(Src) 98.6 F (37 C) (Oral)  Resp 20  Ht 5\' 4"  (1.626 m)  Wt 158 lb 2 oz (71.725 kg)  BMI 27.13 kg/m2  SpO2 99%  LMP 05/09/2014 Physical Exam  Constitutional: She is oriented to person, place, and time. She appears well-developed and well-nourished. No distress.  Nontoxic appearing.  HENT:  Head: Normocephalic and atraumatic.  Mouth/Throat: Oropharynx is clear and moist. No oropharyngeal exudate.  Eyes: Conjunctivae are normal. Pupils are equal, round, and reactive to light. Right eye exhibits no discharge. Left eye exhibits no discharge.  Neck: Neck supple.  Cardiovascular: Normal rate, regular rhythm, normal heart sounds and intact distal pulses.  Exam reveals no gallop and no friction rub.   No murmur heard. Pulmonary/Chest: Effort normal and breath sounds normal. No respiratory distress. She has no wheezes. She has no rales.  Abdominal: Soft. Bowel sounds are normal. She  exhibits no distension and no mass. There is tenderness. There is no rebound and no guarding.  Abdomen is soft. Bowel sounds are present. There is mild diffuse abdominal tenderness without focal tenderness. No rebound  tenderness. Negative psoas and obturator sign. Negative Rovsing sign. No CVA tenderness.  Musculoskeletal: She exhibits no edema.  Lymphadenopathy:    She has no cervical adenopathy.  Neurological: She is alert and oriented to person, place, and time. Coordination normal.  Skin: Skin is warm and dry. No rash noted. She is not diaphoretic. No erythema. No pallor.  Psychiatric: She has a normal mood and affect. Her behavior is normal.  Nursing note and vitals reviewed.   ED Course  Procedures (including critical care time) Labs Review Labs Reviewed  CBC WITH DIFFERENTIAL/PLATELET - Abnormal; Notable for the following:    RBC 5.48 (*)    MCV 77.4 (*)    MCH 25.0 (*)    Neutro Abs 8.0 (*)    All other components within normal limits  COMPREHENSIVE METABOLIC PANEL  URINALYSIS, ROUTINE W REFLEX MICROSCOPIC  POC URINE PREG, ED    Imaging Review No results found.   EKG Interpretation None      Filed Vitals:   06/15/14 1417 06/15/14 1430 06/15/14 1445 06/15/14 1524  BP: 110/77 112/61 102/55 97/66  Pulse: 79 85 80 73  Temp:   98.2 F (36.8 C) 98.6 F (37 C)  TempSrc:   Oral Oral  Resp: 16  18 20   Height:      Weight:      SpO2: 100% 100% 100% 99%     MDM   Meds given in ED:  Medications  sodium chloride 0.9 % bolus 1,000 mL (0 mLs Intravenous Stopped 06/15/14 1523)  ondansetron (ZOFRAN) injection 4 mg (4 mg Intravenous Given 06/15/14 1348)  dicyclomine (BENTYL) capsule 10 mg (10 mg Oral Given 06/15/14 1349)  acetaminophen (TYLENOL) tablet 500 mg (500 mg Oral Given 06/15/14 1348)    Discharge Medication List as of 06/15/2014  3:15 PM    START taking these medications   Details  dicyclomine (BENTYL) 20 MG tablet Take 1 tablet (20 mg total) by mouth 2 (two) times daily., Starting 06/15/2014, Until Discontinued, Print    ondansetron (ZOFRAN ODT) 4 MG disintegrating tablet Take 1 tablet (4 mg total) by mouth every 8 (eight) hours as needed for nausea or vomiting., Starting  06/15/2014, Until Discontinued, Print        Final diagnoses:  Nausea vomiting and diarrhea   This is a 29 year old female who presents emergency department with nausea, vomiting and diarrhea after suspicious food intake. She reports her son who ate the same thing is also sick with similar symptoms. On exam patient is afebrile and nontoxic appearing. Her abdomen is soft and she has mild diffuse abdominal tenderness without focal tenderness. No peritoneal signs. She has a negative urine pregnancy test. Her urinalysis is unremarkable. Her CBC is unremarkable. Her CMP is within normal limits. She reports she last vomited at 10 AM. The patient tolerated by mouth Tylenol and Bentyl in the emergency department. She reports her nausea resolved after IV Zofran. Reevaluation she reports feeling much better and ready for discharge. We'll discharge the patient with prescriptions for Bentyl and Zofran. I advised the patient to follow-up with their primary care provider this week. I advised the patient to return to the emergency department with new or worsening symptoms or new concerns. The patient verbalized understanding and agreement with plan.  Waynetta Pean, PA-C 06/15/14 [place], MD 06/16/14 [phone]

## 2014-06-15 NOTE — Discharge Instructions (Signed)

## 2015-07-18 IMAGING — US US TRANSVAGINAL NON-OB
1 series · 14 of 25 positions shown · non-contrast
Comparison: None

CLINICAL DATA: Abdominal pain, central pelvic pain for 2 days,
irregular menses with LMP 04/13/2013



[Series 1: us transvaginal non-ob · 0.26mm/px · 14 of 57 slices shown]
[im 1/57]
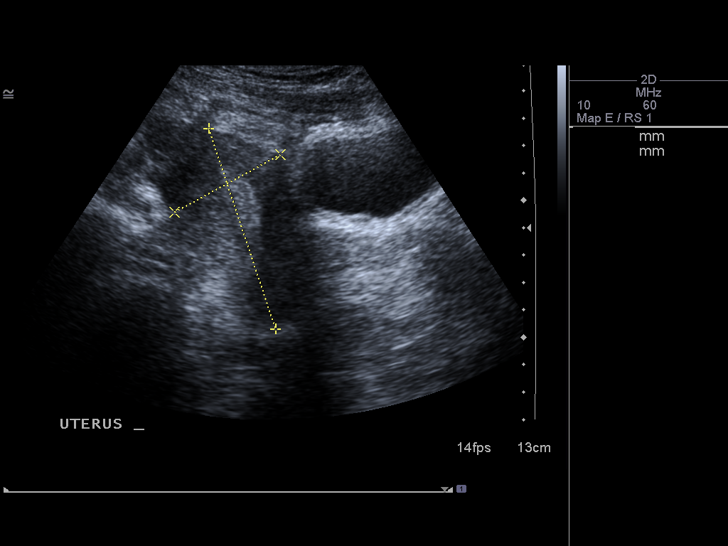
[im 5/57]
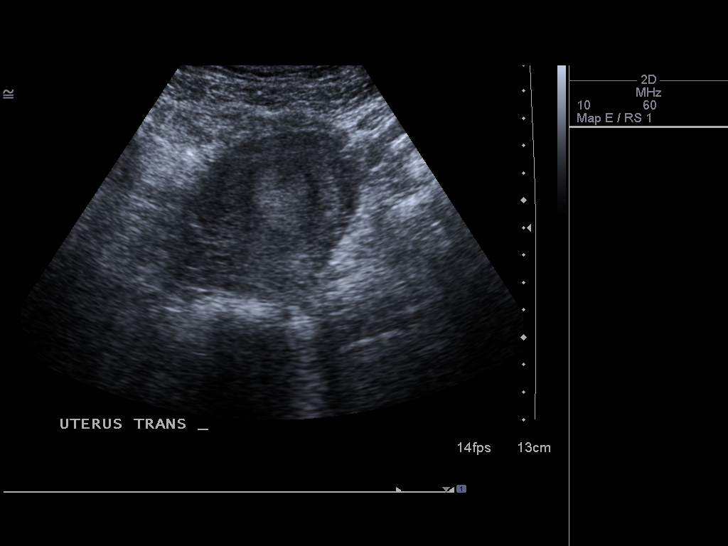
[im 10/57]
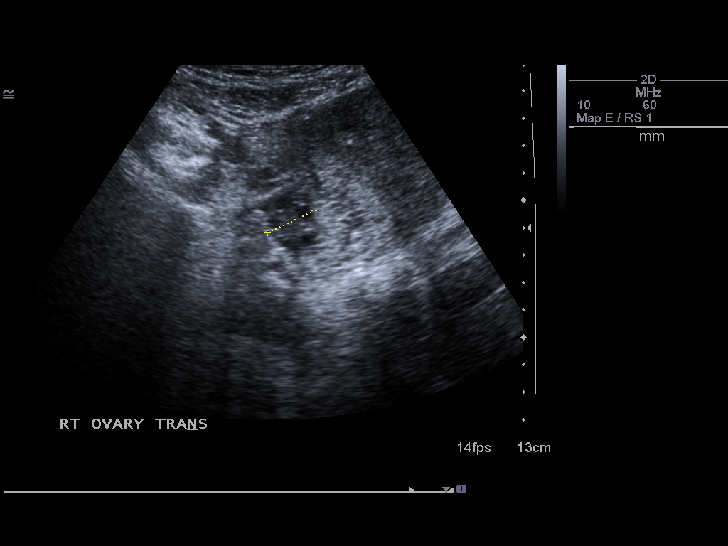
[im 15/57]
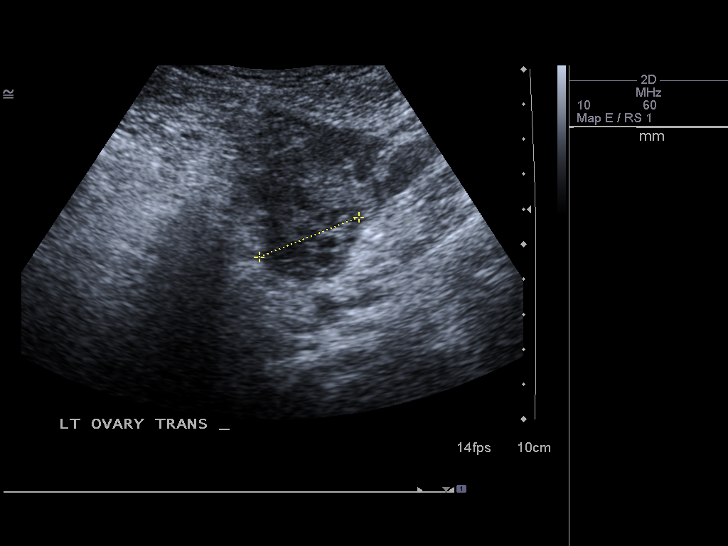
[im 19/57]
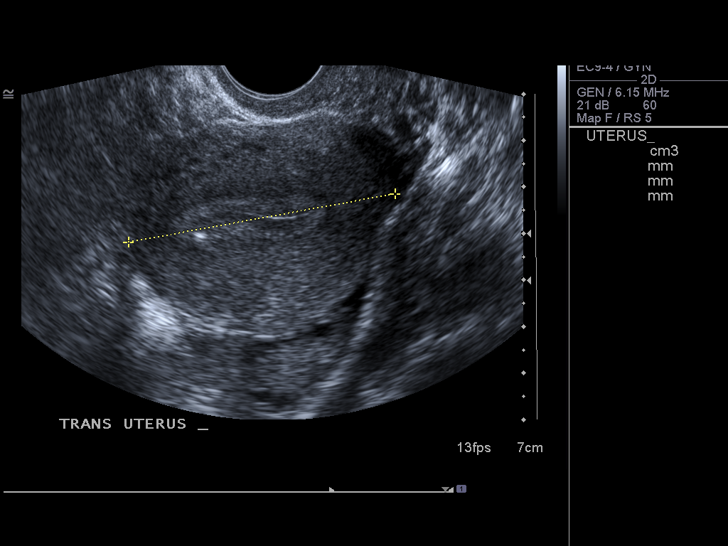
[im 22/57]
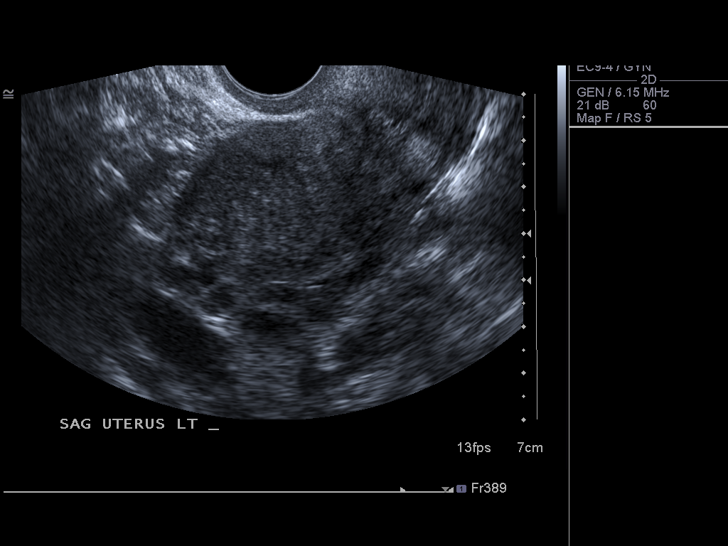
[im 26/57]
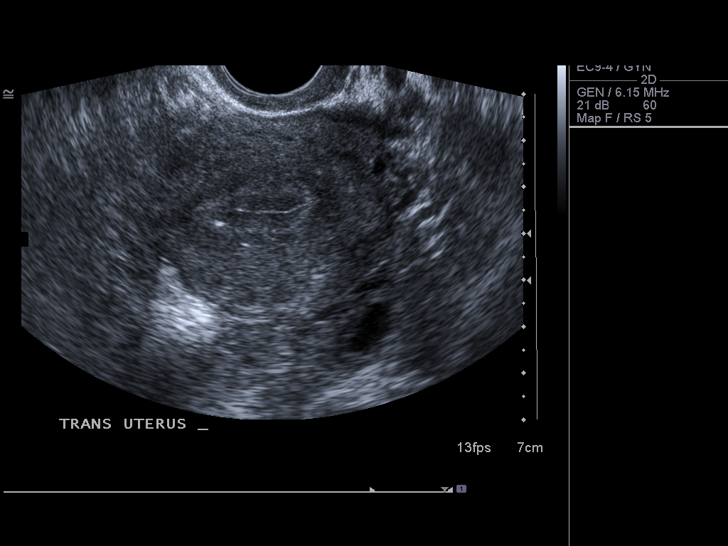
[im 31/57]
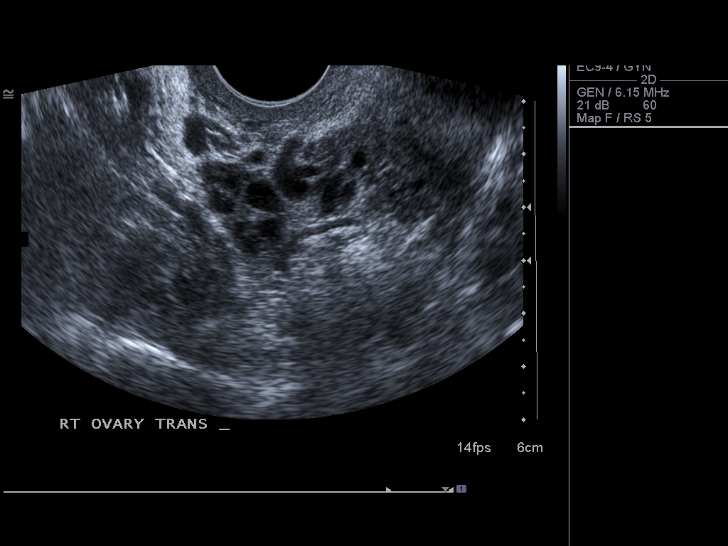
[im 36/57]
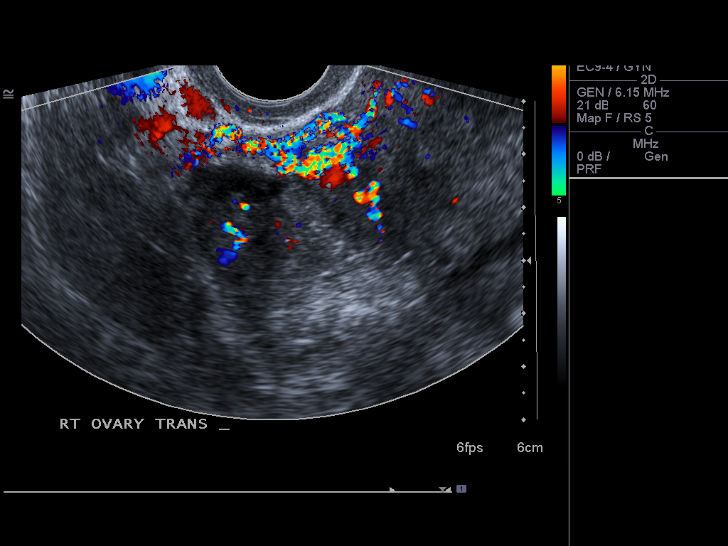
[im 38/57]
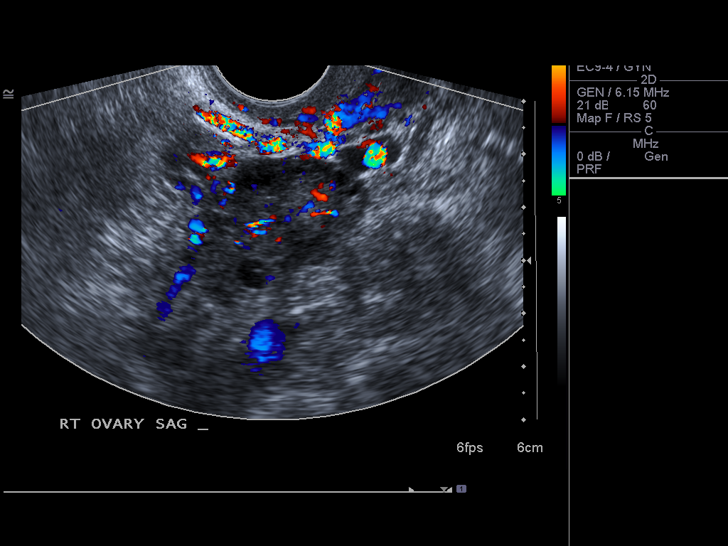
[im 43/57]
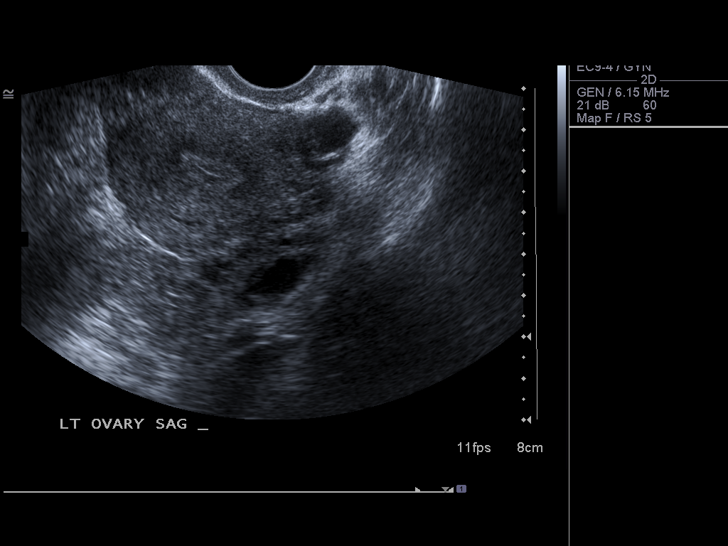
[im 47/57]
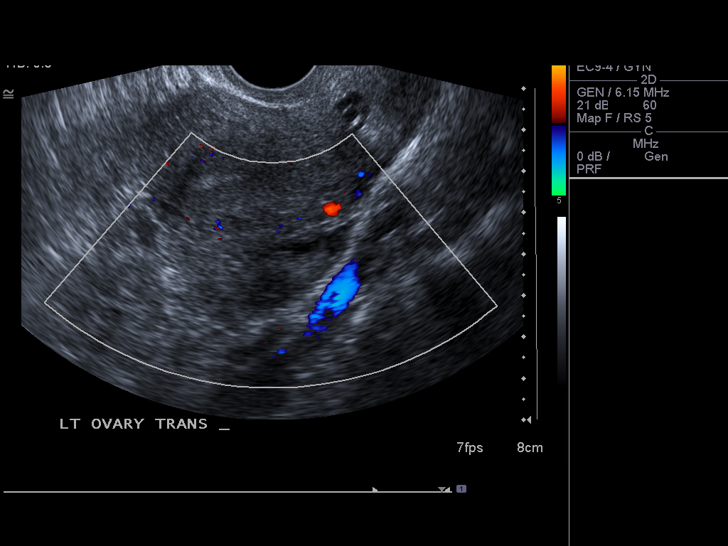
[im 52/57]
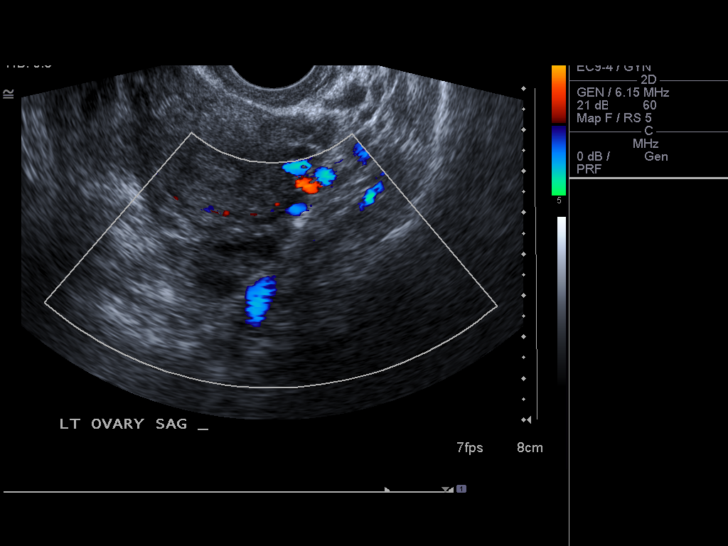
[im 57/57]
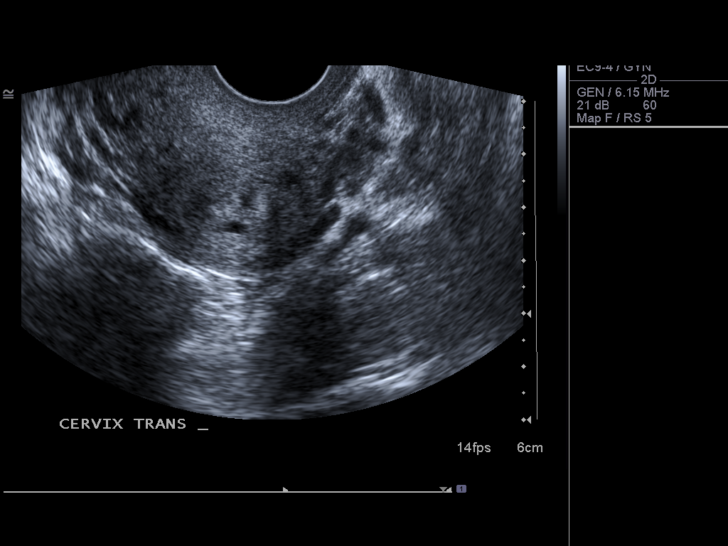

[14 of 25 positions shown; findings below may reference images not displayed]

FINDINGS: Uterus

Measurements: 8.0 x 4.2 x 5.8 cm. Normal morphology without mass

Endometrium

Thickness: 10 mm thick, normal. No endometrial fluid or focal mass.
Single echogenic focus submucosal at the fundus without shadowing,
question tiny calcification.

Right ovary

Measurements: 3.6 x 2.3 x 2.0 cm. Normal morphology without mass.

Left ovary

Measurements: 3.7 x 1.7 x 2.6 cm. Normal only without mass.

Other findings

Trace free pelvic fluid.  No adnexal masses.
IMPRESSION: No significant pelvic sonographic abnormalities identified.

## 2015-08-20 ENCOUNTER — Emergency Department (HOSPITAL_COMMUNITY): Payer: Self-pay

## 2015-08-20 ENCOUNTER — Encounter (HOSPITAL_COMMUNITY): Payer: Self-pay | Admitting: Emergency Medicine

## 2015-08-20 ENCOUNTER — Emergency Department (HOSPITAL_COMMUNITY)
Admission: EM | Admit: 2015-08-20 | Discharge: 2015-08-20 | Disposition: A | Discharge status: Home / Self Care | Payer: Self-pay | Attending: Emergency Medicine | Admitting: Emergency Medicine

## 2015-08-20 DIAGNOSIS — R439 Unspecified disturbances of smell and taste: Secondary | ICD-10-CM | POA: Insufficient documentation

## 2015-08-20 DIAGNOSIS — R432 Parageusia: Secondary | ICD-10-CM

## 2015-08-20 DIAGNOSIS — R430 Anosmia: Secondary | ICD-10-CM

## 2015-08-20 NOTE — ED Notes (Signed)
Patient transported to CT 

## 2015-08-20 NOTE — ED Provider Notes (Signed)
[facility] DEPT Provider Note   CSN: [id number] Arrival date & time: 08/20/15  [id number]  First Provider Contact:  None   By signing my name below, I, Wanda Nunez, attest that this documentation has been prepared under the direction and in the presence of Kalman Drape, PA. Electronically Signed: Randa Nunez, ED Scribe. 08/20/15. 10:06 PM.     History   Chief Complaint No chief complaint on file.   HPI Wanda Nunez is a 30 y.o. female.  The history is provided by the patient. No language interpreter was used.   HPI Comments: Wanda Nunez is a 30 y.o. female who presents to the Emergency Department complaining of loss of taste and smell onset 6 months prior. Pt states that she is able to differentiate between sweet and salty  Pt reports associated congestion. Pt report that she initially thought her symptoms where related to allergies. She states that she has tried allergy medications with no relief. Pt also reports that she has had intermittent blurred vision that began 1 month ago. Pt reports that she does wear glasses but her vision is still intermittently blurry. Pt denies HA, dizziness, CP, SOB, numbness, weakness, trouble swallowing or rash.      History reviewed. No pertinent past medical history.  There are no active problems to display for this patient.   History reviewed. No pertinent surgical history.  OB History    Gravida Para Term Preterm AB Living   1             SAB TAB Ectopic Multiple Live Births                   Home Medications    Prior to Admission medications   Medication Sig Start Date End Date Taking? Authorizing Provider  dicyclomine (BENTYL) 20 MG tablet Take 1 tablet (20 mg total) by mouth 2 (two) times daily. 06/15/14   Waynetta Pean, PA-C  ondansetron (ZOFRAN ODT) 4 MG disintegrating tablet Take 1 tablet (4 mg total) by mouth every 8 (eight) hours as needed for nausea or vomiting. 06/15/14   Waynetta Pean, PA-C    Family  History No family history on file.  Social History Social History  Substance Use Topics  . Smoking status: Never Smoker  . Smokeless tobacco: Not on file  . Alcohol use No     Allergies   Review of patient's allergies indicates no known allergies.   Review of Systems Review of Systems  HENT: Positive for congestion. Negative for trouble swallowing.   Respiratory: Negative for shortness of breath.   Cardiovascular: Negative for chest pain.  Skin: Negative for rash.  Neurological: Negative for dizziness, weakness, numbness and headaches.     Physical Exam Updated Vital Signs BP 145/81 (BP Location: Left Arm)   Pulse 103   Temp 98.3 F (36.8 C) (Oral)   Resp 20   LMP 07/07/2015 (Approximate)   SpO2 100%   Physical Exam Physical Exam  Constitutional: Pt is oriented to person, place, and time. Pt appears well-developed and well-nourished. No distress.  HENT:  Head: Normocephalic and atraumatic.  Mouth/Throat: Oropharynx is clear and moist.  Eyes: Conjunctivae and EOM are normal. Pupils are equal, round, and reactive to light. No scleral icterus.  No horizontal, vertical or rotational nystagmus  Neck: Normal range of motion. Neck supple.  Full active and passive ROM without pain No midline or paraspinal tenderness No nuchal rigidity or meningeal signs  Cardiovascular: Normal rate, regular rhythm and intact distal  pulses.   Pulmonary/Chest: Effort normal and breath sounds normal. No respiratory distress. Pt has no wheezes. No rales.  Abdominal: Soft. Bowel sounds are normal. There is no tenderness. There is no rebound and no guarding.  Musculoskeletal: Normal range of motion.  Lymphadenopathy:    No cervical adenopathy.  Neurological: Pt. is alert and oriented to person, place, and time. He has normal reflexes. No cranial nerve deficit.  Exhibits normal muscle tone. Coordination normal.  Mental Status:  Alert, oriented, thought content appropriate. Speech fluent  without evidence of aphasia. Able to follow 2 step commands without difficulty.  Cranial Nerves:  II:  Peripheral visual fields grossly normal, pupils equal, round, reactive to light III,IV, VI: ptosis not present, extra-ocular motions intact bilaterally  V,VII: smile symmetric, facial light touch sensation equal VIII: hearing grossly normal bilaterally  IX,X: midline uvula rise  XI: bilateral shoulder shrug equal and strong XII: midline tongue extension  Motor:  5/5 in upper and lower extremities bilaterally including strong and equal grip strength and dorsiflexion/plantar flexion Sensory: Pinprick and light touch normal in all extremities.  Deep Tendon Reflexes: 2+ and symmetric  Cerebellar: normal finger-to-nose with bilateral upper extremities Gait: normal gait and balance CV: distal pulses palpable throughout   Skin: Skin is warm and dry. No rash noted. Pt is not diaphoretic.  Psychiatric: Pt has a normal mood and affect. Behavior is normal. Judgment and thought content normal.  Nursing note and vitals reviewed.    ED Treatments / Results  DIAGNOSTIC STUDIES: Oxygen Saturation is 100% on RA, normal by my interpretation.    COORDINATION OF CARE: 2:41 AM-Discussed treatment plan which includes CT head with pt at bedside and pt agreed to plan.    Labs (all labs ordered are listed, but only abnormal results are displayed) Labs Reviewed - No data to display  EKG  EKG Interpretation None       Radiology Ct Head Wo Contrast  Result Date: 08/20/2015 CLINICAL DATA:  Olfactory since loss. EXAM: CT HEAD WITHOUT CONTRAST TECHNIQUE: Contiguous axial images were obtained from the base of the skull through the vertex without intravenous contrast. COMPARISON:  None. FINDINGS: Brain: No evidence of acute infarction, hemorrhage, extra-axial collection, ventriculomegaly, or mass effect. Mild brain parenchymal volume loss, unusual for patient's age. Vascular: No hyperdense vessel or  unexpected calcification. Skull: Negative for fracture or focal lesion. Sinuses/Orbits: No acute findings. Other: None. IMPRESSION: No acute intracranial abnormality. Mild brain parenchymal volume loss, unusual for patient's age. No abnormalities within the paranasal sinuses. Electronically Signed   By: Fidela Salisbury M.D.   On: 08/20/2015 23:30   Procedures Procedures (including critical care time)  Medications Ordered in ED Medications - No data to display   Initial Impression / Assessment and Plan / ED Course  I have reviewed the triage vital signs and the nursing notes.  Pertinent labs & imaging results that were available during my care of the patient were reviewed by me and considered in my medical decision making (see chart for details).  Clinical Course   Patient presents with loss of taste and smell for 6 months. Obtained CT of head to rule out intracranial lesion. CT reviewed by me revealed no acute intracranial abnormality with mild brain parenchymal volume loss. Cannot explain the etiology of patient's symptoms at this time. I gave the patient contact information for a neurologist and instructed her to call them on Monday to set up an appointment to be seen as soon as possible. I discussed strict  return precautions. Patient expressed understanding to the discharge instructions.  Case discussed with Dr. Jeneen Rinks who agrees with the above plan.  I personally performed the services described in this documentation, which was scribed in my presence. The recorded information has been reviewed and is accurate.   Final Clinical Impressions(s) / ED Diagnoses   Final diagnoses:  Loss of smell  Loss of taste    New Prescriptions Discharge Medication List as of 08/20/2015 11:39 PM        Kalman Drape, PA 08/22/15 0243    Tanna Furry, MD 09/03/15 2154

## 2015-08-20 NOTE — ED Triage Notes (Signed)
Pt. reports she can not taste or smell anything onset several months ago , airway intact /no oral swelling , denies SOB .

## 2015-08-20 NOTE — Discharge Instructions (Signed)
Follow-up with neurology within 1 week to be seen regarding your symptoms. Return to emergency department if you experience headache, visual changes, dizziness, you pass out, numbness/timing, weakness or any other concerning symptoms.

## 2016-07-03 ENCOUNTER — Encounter (HOSPITAL_COMMUNITY): Payer: Self-pay | Admitting: *Deleted

## 2016-07-03 ENCOUNTER — Emergency Department (HOSPITAL_COMMUNITY)
Admission: EM | Admit: 2016-07-03 | Discharge: 2016-07-04 | Disposition: A | Discharge status: Home / Self Care | Payer: Medicaid - Out of State | Attending: Emergency Medicine | Admitting: Emergency Medicine

## 2016-07-03 DIAGNOSIS — L738 Other specified follicular disorders: Secondary | ICD-10-CM

## 2016-07-03 DIAGNOSIS — L731 Pseudofolliculitis barbae: Secondary | ICD-10-CM | POA: Insufficient documentation

## 2016-07-03 NOTE — ED Triage Notes (Signed)
Pt c/o rash to vagina x 1 week. Denies NVD, vag discharge, or abd pain. Reports rash appeared after shaving. Denies pain, c/o itching

## 2016-07-04 MED ORDER — CEPHALEXIN 250 MG PO CAPS
500.0000 mg | ORAL_CAPSULE | Freq: Once | ORAL | Status: AC
  Administered 2016-07-04: 500 mg via ORAL
  Filled 2016-07-04: qty 2

## 2016-07-04 MED ORDER — CEPHALEXIN 500 MG PO CAPS: 500.0000 mg | ORAL_CAPSULE | Freq: Four times a day (QID) | ORAL | 0 refills | Status: DC

## 2016-07-04 NOTE — ED Provider Notes (Signed)
[facility] DEPT Provider Note   CSN: [id number] Arrival date & time: 07/03/16  2304     History   Chief Complaint Chief Complaint  Patient presents with  . Vaginitis    HPI Wanda Nunez is a 31 y.o. female.  Patient states that she normally shaves her pubic area less.  She reports approximate 2 weeks ago week ago she noticed some itching in the area abdominally on the right side and then some "bumps" developed. .  She's been rubbing it with a cloth is slightly itchy.  Has not noticed any drainage or discharge or tenderness.      History reviewed. No pertinent past medical history.  There are no active problems to display for this patient.   History reviewed. No pertinent surgical history.  OB History    Gravida Para Term Preterm AB Living   1             SAB TAB Ectopic Multiple Live Births                   Home Medications    Prior to Admission medications   Medication Sig Start Date End Date Taking? Authorizing Provider  dicyclomine (BENTYL) 20 MG tablet Take 1 tablet (20 mg total) by mouth 2 (two) times daily. 06/15/14   Waynetta Pean, PA-C  ondansetron (ZOFRAN ODT) 4 MG disintegrating tablet Take 1 tablet (4 mg total) by mouth every 8 (eight) hours as needed for nausea or vomiting. 06/15/14   Waynetta Pean, PA-C    Family History No family history on file.  Social History Social History  Substance Use Topics  . Smoking status: Never Smoker  . Smokeless tobacco: Never Used  . Alcohol use No     Allergies   Patient has no known allergies.   Review of Systems Review of Systems  Constitutional: Negative for fever.  Musculoskeletal: Negative for myalgias.  Skin: Positive for wound.  All other systems reviewed and are negative.    Physical Exam Updated Vital Signs BP 127/89 (BP Location: Left Arm)   Pulse 93   Temp 98.1 F (36.7 C) (Oral)   Resp 16   LMP 06/08/2015   SpO2 100%   Breastfeeding? Unknown   Physical Exam    Constitutional: She appears well-developed and well-nourished.  HENT:  Head: Normocephalic.  Eyes: Pupils are equal, round, and reactive to light.  Neck: Normal range of motion.  Cardiovascular: Normal rate.   Pulmonary/Chest: Effort normal.  Abdominal: Soft.  Musculoskeletal: Normal range of motion.  Neurological: She is alert.  Skin: Rash noted.     Nursing note and vitals reviewed.    ED Treatments / Results  Labs (all labs ordered are listed, but only abnormal results are displayed) Labs Reviewed - No data to display  EKG  EKG Interpretation None       Radiology No results found.  Procedures Procedures (including critical care time)  Medications Ordered in ED Medications  cephALEXin (KEFLEX) capsule 500 mg (not administered)     Initial Impression / Assessment and Plan / ED Course  I have reviewed the triage vital signs and the nursing notes.  Pertinent labs & imaging results that were available during my care of the patient were reviewed by me and considered in my medical decision making (see chart for details).     Patient has what appears to be folliculitis.  She'll be started on Keflex She has been instructed not to shave the area until all the  raised areas have resolved   Final Clinical Impressions(s) / ED Diagnoses   Final diagnoses:  Folliculitis barbae    New Prescriptions New Prescriptions   No medications on file     Junius Creamer, NP 07/04/16 0018    Junius Creamer, NP 77/03/40 3524    Delora Fuel, MD 81/85/90 [phone]

## 2016-07-04 NOTE — Discharge Instructions (Signed)
Living given a prescription for antibiotic.  Please take this as directed until all tablets have been completed Please try to avoid shaving this area until all the red, raised areas have resolved. Then as discussed please use a new razor all shaving her pubic area.

## 2017-09-12 ENCOUNTER — Encounter (HOSPITAL_COMMUNITY): Payer: Self-pay

## 2017-09-12 ENCOUNTER — Other Ambulatory Visit: Payer: Self-pay

## 2017-09-12 ENCOUNTER — Ambulatory Visit (HOSPITAL_COMMUNITY)
Admission: EM | Admit: 2017-09-12 | Discharge: 2017-09-12 | Disposition: A | Discharge status: Home / Self Care | Payer: Medicaid Other | Attending: Family Medicine | Admitting: Family Medicine

## 2017-09-12 DIAGNOSIS — Z3202 Encounter for pregnancy test, result negative: Secondary | ICD-10-CM

## 2017-09-12 DIAGNOSIS — R196 Halitosis: Secondary | ICD-10-CM | POA: Diagnosis not present

## 2017-09-12 DIAGNOSIS — R1013 Epigastric pain: Secondary | ICD-10-CM

## 2017-09-12 LAB — POCT URINALYSIS DIP (DEVICE)
BILIRUBIN URINE: NEGATIVE
Glucose, UA: NEGATIVE mg/dL
Ketones, ur: NEGATIVE mg/dL
LEUKOCYTES UA: NEGATIVE
NITRITE: NEGATIVE
Protein, ur: NEGATIVE mg/dL
SPECIFIC GRAVITY, URINE: 1.025 (ref 1.005–1.030)
Urobilinogen, UA: 0.2 mg/dL (ref 0.0–1.0)
pH: 7 (ref 5.0–8.0)

## 2017-09-12 LAB — POCT PREGNANCY, URINE: Preg Test, Ur: NEGATIVE

## 2017-09-12 MED ORDER — OMEPRAZOLE 20 MG PO CPDR: 20.0000 mg | DELAYED_RELEASE_CAPSULE | Freq: Every day | ORAL | 0 refills | Status: DC

## 2017-09-12 MED ORDER — GI COCKTAIL ~~LOC~~
ORAL | Status: AC
  Filled 2017-09-12: qty 30

## 2017-09-12 MED ORDER — GI COCKTAIL ~~LOC~~
30.0000 mL | Freq: Once | ORAL | Status: AC
  Administered 2017-09-12: 30 mL via ORAL

## 2017-09-12 NOTE — ED Provider Notes (Signed)
[place]   [id number] 09/12/17 Arrival Time: 2536  CC: ABDOMINAL DISCOMFORT  SUBJECTIVE:  Wanda Nunez is a 32 y.o. female who presents with complaint of abdominal discomfort that began abruptly 5 days ago.  Denies a precipitating event, or trauma.  Denies recent travel or close contacts with similar symptoms.  Localizes discomfort to epigastric region.  Describes as worsening, constant and achy in character.  Pain level is about a 6/10.  Has tried TUMS and pepto without relief.  Denies alleviating or aggravating factors.  Denies association with food or drink.  Denies similar symptoms in the past.  Last BM last night and normal for pateint.  Complains of weight loss of 8lbs in the past week, and halitosis.   Denies fever, chills, appetite changes, nausea, vomiting, chest pain, SOB, diarrhea, constipation, hematochezia, melena, dysuria, difficulty urinating, increased frequency or urgency, flank pain, loss of bowel or bladder function, vaginal symptoms.    Last colonoscopy 3 years ago for rectal bleeding.  Colonoscopy significant for polyps and hemorrhoids.  Complains of 1 episode of wiping with blood on TP a few days ago.     Patient's last menstrual period was 08/26/2017.  ROS: As per HPI.  History reviewed. No pertinent past medical history. History reviewed. No pertinent surgical history. No Known Allergies No current facility-administered medications on file prior to encounter.    Current Outpatient Medications on File Prior to Encounter  Medication Sig Dispense Refill  . cephALEXin (KEFLEX) 500 MG capsule Take 1 capsule (500 mg total) by mouth 4 (four) times daily. 20 capsule 0  . dicyclomine (BENTYL) 20 MG tablet Take 1 tablet (20 mg total) by mouth 2 (two) times daily. 20 tablet 0  . ondansetron (ZOFRAN ODT) 4 MG disintegrating tablet Take 1 tablet (4 mg total) by mouth every 8 (eight) hours as needed for nausea or vomiting. 10 tablet 0   Social History    Socioeconomic History  . Marital status: Single    Spouse name: Not on file  . Number of children: Not on file  . Years of education: Not on file  . Highest education level: Not on file  Occupational History  . Not on file  Social Needs  . Financial resource strain: Not on file  . Food insecurity:    Worry: Not on file    Inability: Not on file  . Transportation needs:    Medical: Not on file    Non-medical: Not on file  Tobacco Use  . Smoking status: Never Smoker  . Smokeless tobacco: Never Used  Substance and Sexual Activity  . Alcohol use: No  . Drug use: No  . Sexual activity: Yes    Birth control/protection: None  Lifestyle  . Physical activity:    Days per week: Not on file    Minutes per session: Not on file  . Stress: Not on file  Relationships  . Social connections:    Talks on phone: Not on file    Gets together: Not on file    Attends religious service: Not on file    Active member of club or organization: Not on file    Attends meetings of clubs or organizations: Not on file    Relationship status: Not on file  . Intimate partner violence:    Fear of current or ex partner: Not on file    Emotionally abused: Not on file    Physically abused: Not on file    Forced sexual activity: Not on  file  Other Topics Concern  . Not on file  Social History Narrative  . Not on file   History reviewed. No pertinent family history.  OBJECTIVE:  Vitals:   09/12/17 0911 09/12/17 0915  BP: 139/90   Pulse: 88   Resp: 16   Temp: 98.3 F (36.8 C)   TempSrc: Oral   SpO2: 98%   Weight:  160 lb (72.6 kg)    General appearance: AOx3 in no acute distress; nontoxic appearance HEENT: NCAT.  Oropharynx clear.   Lungs: clear to auscultation bilaterally without adventitious breath sounds Heart: regular rate and rhythm.  Radial pulses 2+ symmetrical bilaterally Abdomen: soft, non-distended; normal active bowel sounds; tender to light and deep palpation over the  epigastric region with mild suprapubic tenderness; nontender at McBurney's point; negative Murphy's sign; negative rebound; no guarding Rectal: Declines Back: no CVA tenderness Extremities: no edema; symmetrical with no gross deformities Skin: warm and dry Neurologic: normal gait Psychological: alert and cooperative; normal mood and affect  Labs: Results for orders placed or performed during the hospital encounter of 09/12/17 (from the past 24 hour(s))  POCT urinalysis dip (device)     Status: Abnormal   Collection Time: 09/12/17  9:20 AM  Result Value Ref Range   Glucose, UA NEGATIVE NEGATIVE mg/dL   Bilirubin Urine NEGATIVE NEGATIVE   Ketones, ur NEGATIVE NEGATIVE mg/dL   Specific Gravity, Urine 1.025 1.005 - 1.030   Hgb urine dipstick TRACE (A) NEGATIVE   pH 7.0 5.0 - 8.0   Protein, ur NEGATIVE NEGATIVE mg/dL   Urobilinogen, UA 0.2 0.0 - 1.0 mg/dL   Nitrite NEGATIVE NEGATIVE   Leukocytes, UA NEGATIVE NEGATIVE  Pregnancy, urine POC     Status: None   Collection Time: 09/12/17  9:29 AM  Result Value Ref Range   Preg Test, Ur NEGATIVE NEGATIVE    ASSESSMENT & PLAN:  1. Epigastric discomfort   2. Halitosis     Meds ordered this encounter  Medications  . gi cocktail (Maalox,Lidocaine,Donnatal)  . omeprazole (PRILOSEC) 20 MG capsule    Sig: Take 1 capsule (20 mg total) by mouth daily.    Dispense:  30 capsule    Refill:  0    Order Specific Question:   Supervising Provider    Answer:   Wynona Luna [[id number]]   GI cocktail given.  Reports relief in symptoms.   Urine did not show signs of infection, just trace blood.  Please follow up with PCP or with [facility] in 1-2 weeks to have urine rechecked Omeprazole prescribed take daily Avoid eating 2-3 hours before bed Elevate head of bed.  Avoid chocolate, caffeine, alcohol, onion, and mint prior to bed.  This relaxes the bottom part of your esophagus and can make your symptoms worse.  Primary care  provider assistance initiated to establish care  Follow up with PCP or [facility] if symptoms persists Return or go to the ED if you have any new or worsening symptoms such as fever, chills, nausea, vomiting, worsening abdominal pain, diarrhea, blood in stool, dark tarry stools, etc...  Discussed halitosis could be related to recently treated dental infection or Zenker diverticulum.  Recommended follow up with PCP for further evaluation and management.    Reviewed expectations re: course of current medical issues. Questions answered. Outlined signs and symptoms indicating need for more acute intervention. Patient verbalized understanding. After Visit Summary given.   Lestine Box, PA-C 09/12/17 1058

## 2017-09-12 NOTE — Discharge Instructions (Addendum)
GI cocktail given.  Reports relief in symptoms.   Urine did not show signs of infection, just trace blood.  Please follow up with PCP or with [facility] in 1-2 weeks to have urine rechecked Omeprazole prescribed take daily Avoid eating 2-3 hours before bed Elevate head of bed.  Avoid chocolate, caffeine, alcohol, onion, and mint prior to bed.  This relaxes the bottom part of your esophagus and can make your symptoms worse.  Primary care provider assistance initiated to establish care  Follow up with PCP or [facility] if symptoms persists Return or go to the ED if you have any new or worsening symptoms such as fever, chills, nausea, vomiting, worsening abdominal pain, diarrhea, blood in stool, dark tarry stools, etc..Marland Kitchen

## 2017-09-12 NOTE — ED Notes (Signed)
UA Sample given

## 2017-09-12 NOTE — ED Triage Notes (Signed)
Abdominal pain x 5 days.

## 2017-10-28 ENCOUNTER — Ambulatory Visit: Payer: Medicaid Other | Attending: Family Medicine | Admitting: Family Medicine

## 2017-10-28 ENCOUNTER — Encounter: Payer: Self-pay | Admitting: Family Medicine

## 2017-10-28 VITALS — BP 112/78 | HR 97 | Temp 98.5°F

## 2017-10-28 DIAGNOSIS — R194 Change in bowel habit: Secondary | ICD-10-CM

## 2017-10-28 DIAGNOSIS — R1024 Suprapubic pain: Secondary | ICD-10-CM

## 2017-10-28 DIAGNOSIS — R102 Pelvic and perineal pain: Secondary | ICD-10-CM

## 2017-10-28 DIAGNOSIS — R1013 Epigastric pain: Secondary | ICD-10-CM

## 2017-10-28 DIAGNOSIS — R8290 Unspecified abnormal findings in urine: Secondary | ICD-10-CM

## 2017-10-28 DIAGNOSIS — K5900 Constipation, unspecified: Secondary | ICD-10-CM

## 2017-10-28 DIAGNOSIS — R1011 Right upper quadrant pain: Secondary | ICD-10-CM

## 2017-10-28 DIAGNOSIS — R5383 Other fatigue: Secondary | ICD-10-CM

## 2017-10-28 DIAGNOSIS — R0981 Nasal congestion: Secondary | ICD-10-CM

## 2017-10-28 DIAGNOSIS — R072 Precordial pain: Secondary | ICD-10-CM | POA: Diagnosis not present

## 2017-10-28 LAB — POCT URINALYSIS DIP (CLINITEK)
Bilirubin, UA: NEGATIVE
Glucose, UA: NEGATIVE mg/dL
Ketones, POC UA: NEGATIVE mg/dL
Leukocytes, UA: NEGATIVE
Nitrite, UA: NEGATIVE
POC,PROTEIN,UA: NEGATIVE
Spec Grav, UA: >=1.03 — AB
Urobilinogen, UA: 0.2 U/dL
pH, UA: 6

## 2017-10-28 MED ORDER — PANTOPRAZOLE SODIUM 40 MG PO TBEC: 40.0000 mg | DELAYED_RELEASE_TABLET | Freq: Two times a day (BID) | ORAL | 2 refills | Status: DC

## 2017-10-28 MED ORDER — POLYETHYLENE GLYCOL 3350 17 GM/SCOOP PO POWD: 17.0000 g | Freq: Two times a day (BID) | ORAL | 1 refills | Status: DC | PRN

## 2017-10-28 NOTE — Patient Instructions (Signed)
Abdominal Pain, Adult °Many things can cause belly (abdominal) pain. Most times, belly pain is not dangerous. Many cases of belly pain can be watched and treated at home. Sometimes belly pain is serious, though. Your doctor will try to find the cause of your belly pain. °Follow these instructions at home: °· Take over-the-counter and prescription medicines only as told by your doctor. Do not take medicines that help you poop (laxatives) unless told to by your doctor. °· Drink enough fluid to keep your pee (urine) clear or pale yellow. °· Watch your belly pain for any changes. °· Keep all follow-up visits as told by your doctor. This is important. °Contact a doctor if: °· Your belly pain changes or gets worse. °· You are not hungry, or you lose weight without trying. °· You are having trouble pooping (constipated) or have watery poop (diarrhea) for more than 2-3 days. °· You have pain when you pee or poop. °· Your belly pain wakes you up at night. °· Your pain gets worse with meals, after eating, or with certain foods. °· You are throwing up and cannot keep anything down. °· You have a fever. °Get help right away if: °· Your pain does not go away as soon as your doctor says it should. °· You cannot stop throwing up. °· Your pain is only in areas of your belly, such as the right side or the left lower part of the belly. °· You have bloody or black poop, or poop that looks like tar. °· You have very bad pain, cramping, or bloating in your belly. °· You have signs of not having enough fluid or water in your body (dehydration), such as: °? Dark pee, very little pee, or no pee. °? Cracked lips. °? Dry mouth. °? Sunken eyes. °? Sleepiness. °? Weakness. °This information is not intended to replace advice given to you by your health care provider. Make sure you discuss any questions you have with your health care provider. °Document Released: 06/27/2007 Document Revised: 07/29/2015 Document Reviewed: 06/22/2015 °Elsevier  Interactive Patient Education © 2018 [employer] °Constipation, Adult °Constipation is when a person: °· Poops (has a bowel movement) fewer times in a week than normal. °· Has a hard time pooping. °· Has poop that is dry, hard, or bigger than normal. ° °Follow these instructions at home: °Eating and drinking ° °· Eat foods that have a lot of fiber, such as: °? Fresh fruits and vegetables. °? Whole grains. °? Beans. °· Eat less of foods that are high in fat, low in fiber, or overly processed, such as: °? French fries. °? Hamburgers. °? Cookies. °? Candy. °? Soda. °· Drink enough fluid to keep your pee (urine) clear or pale yellow. °General instructions °· Exercise regularly or as told by your doctor. °· Go to the restroom when you feel like you need to poop. Do not hold it in. °· Take over-the-counter and prescription medicines only as told by your doctor. These include any fiber supplements. °· Do pelvic floor retraining exercises, such as: °? Doing deep breathing while relaxing your lower belly (abdomen). °? Relaxing your pelvic floor while pooping. °· Watch your condition for any changes. °· Keep all follow-up visits as told by your doctor. This is important. °Contact a doctor if: °· You have pain that gets worse. °· You have a fever. °· You have not pooped for 4 days. °· You throw up (vomit). °· You are not hungry. °· You lose weight. °· You   are bleeding from the anus. °· You have thin, pencil-like poop (stool). °Get help right away if: °· You have a fever, and your symptoms suddenly get worse. °· You leak poop or have blood in your poop. °· Your belly feels hard or bigger than normal (is bloated). °· You have very bad belly pain. °· You feel dizzy or you faint. °This information is not intended to replace advice given to you by your health care provider. Make sure you discuss any questions you have with your health care provider. °Document Released: 06/27/2007 Document Revised: 07/29/2015 Document Reviewed:  06/29/2015 °Elsevier Interactive Patient Education © 2018 Elsevier Inc. ° °

## 2017-10-28 NOTE — Progress Notes (Signed)
Subjective:    Patient ID: Wanda Nunez, female    DOB: Jul 12, 1985, 32 y.o.   MRN: [id number]  HPI 32 year old female new to the practice.  Patient with complaint of onset since early August of substernal chest discomfort/burning sensation in her mid chest along with decrease in appetite.  Patient also states that she has noticed a weird odor to her breath.  Patient also feels as if she has had abdominal bloating.  Patient reports a change in her bowel habits that she is now having some diarrhea as well as constipation.  Patient states that she now goes to 3 days without having a bowel movement.  Patient states that she did go to urgent care regarding her symptoms and patient states that she was diagnosed with acid reflux but she does not believe that this is the problem that she is having.  Patient states that she was prescribed a medication, omeprazole which she took until it was gone but  this did not help with her symptoms. (On review of chart, patient was seen at urgent care on 09/12/2017 and given 20-day supply of omeprazole 20 mg once daily.  Patient apparently per ED records had also been on Bentyl and Zofran.  Patient was also given GI cocktail during her urgent care visit with some relief of symptoms.)      Patient reports that she does do a lot of burping and belching in addition to have the burning sensation in her mid chest.  Patient denies any difficulty with swallowing.  Patient does feel as if she takes in a lot of air when she is drinking liquids. Patient has had some mild discomfort across her lower abdomen.  Patient also states that she is currently having issues with recurrent nasal congestion and she has been seen by ear nose and throat as patient states that she is also lost her sensation of smell for several months.  Patient denies any recent fever or chills.  No cough or shortness of breath.  Patient reports no blood in the stool and no dark stools.  Patient reports that she has  otherwise had no past medical problems.  Patient reports no use of tobacco, alcohol or other drugs.  Patient is single and is currently not employed.  Patient's only surgery has been a C-section.  Patient denies any drug allergies.  Patient denies any possibility of current pregnancy.  Patient states her family history is significant for mother with hypertension and type 2 diabetes and family history is negative for coronary artery disease, stroke or cancer.   Review of Systems  Constitutional: Positive for appetite change and fatigue. Negative for chills and fever.  HENT: Positive for congestion. Negative for sinus pressure, sinus pain, sore throat, trouble swallowing and voice change.        Loss of sense of smell-being evaluated at [facility]  Respiratory: Negative for cough and shortness of breath.   Cardiovascular: Positive for leg swelling (feels as if she has had recent swelling). Negative for chest pain and palpitations.  Gastrointestinal: Positive for abdominal distention, abdominal pain, constipation, diarrhea and nausea. Negative for anal bleeding, blood in stool, rectal pain and vomiting.  Genitourinary: Negative for dysuria, frequency, hematuria, pelvic pain and vaginal pain.  Musculoskeletal: Negative for arthralgias, back pain, gait problem, joint swelling and myalgias.  Neurological: Negative for dizziness, light-headedness and headaches.  Hematological: Negative for adenopathy. Does not bruise/bleed easily.       Objective:   Physical Exam BP 112/78  Pulse 97   Temp 98.5 F (36.9 C) (Oral)   SpO2 99%  vital signs and nurses notes were reviewed General-well-nourished, well-developed female in no acute distress.  Patient was initially wearing a half facemask which she later removed. ENT- TMs dull bilaterally, patient with moderate edema of the nasal turbinates with clear nasal discharge, patient with posterior pharynx edema/erythema. Neck-supple, no lymphadenopathy, no  carotid bruit, patient with some mild fullness in the thyroid area Lungs-clear to auscultation bilaterally Cardiovascular-regular rhythm, mild tachycardia on exam Back-patient was a right CVA tenderness on exam and some mild thoracolumbar paraspinous spasm Extremities-no edema      Assessment & Plan:  1. Epigastric pain Patient with epigastric tenderness on examination and patient has had issues with burping/belching and substernal chest pain.  I suspect that patient does have a degree of acid reflux and could have possible gastritis or ulcer.  Patient will have CBC to look for blood loss.  Patient will have CMP to check electrolytes and liver function test.  Patient will have H. pylori breath test to look for bacteria that can cause stomach ulcers.  Patient will also have lipase to look for elevated pancreatic enzyme.  Patient agrees to start use of pantoprazole 40 mg twice daily.  Patient will return to clinic in 2 weeks for reevaluation and depending on if she is having continued epigastric pain/substernal chest pain and the results of her labs and abdominal ultrasound that is been ordered, patient may need referral to gastroenterology as well. - CBC with Differential - Comprehensive metabolic panel - H. pylori breath test - pantoprazole (PROTONIX) 40 MG tablet; Take 1 tablet (40 mg total) by mouth 2 (two) times daily. To reduce stomach acid  Dispense: 60 tablet; Refill: 2 - US Abdomen Complete; Future - Lipase  2. Right upper quadrant pain Patient with complaint of right upper quadrant pain on examination and patient will have abdominal ultrasound scheduled.  Patient will have complete ultrasound so that pancreas and biliary system will also be included in the ultrasound of the liver and gallbladder.  Patient will have CMP to look for electrolyte abnormality or elevated liver function test related to her right upper quadrant pain but discussed with patient that she could have some issues with  her gallbladder that may be contributing to her GI issues. - Comprehensive metabolic panel - US Abdomen Complete; Future  3. Substernal chest pain Patient with complaint of burning substernal chest pain as well as burping and belching.  Patient likely has esophageal reflux and possible esophagitis.  Patient however reports no pain with swallowing and no difficulty swallowing.  Patient will have labs in follow-up including CBC, CMP, H. pylori to look for bacteria that may cause stomach ulcers and patient is to start pantoprazole 40 mg twice daily to reduce stomach acid. - CBC with Differential - Comprehensive metabolic panel - H. pylori breath test - pantoprazole (PROTONIX) 40 MG tablet; Take 1 tablet (40 mg total) by mouth 2 (two) times daily. To reduce stomach acid  Dispense: 60 tablet; Refill: 2  4. Constipation, unspecified constipation type Patient with complaint of recent onset of constipation along with some episodes of diarrhea.  Patient will have thyroid labs to look for possible hypothyroidism as patient also with complaint of recent onset of fatigue and sensation of swelling in her hands and legs.  Patient per review of medical records in urgent care has had several colonoscopies in the past few years which she did not mention at today's visit.  Patient did deny any recent blood in the stool or dark stools and no rectal bleeding.  While patient was on lab, patient asked nurse to ask me about medications for constipation.  Prescription will be sent to patient's pharmacy for MiraLAX to take once daily as needed for constipation or she may take daily to see if this helps to improve her constipation but patient will likely need GI referral at her upcoming visit due to her complaints of recent change in bowel habits. - TSH + free T4  5. Suprapubic discomfort Patient with complaint of recent suprapubic discomfort as well as presence of right CVA tenderness on exam.  Urinalysis was done at today's  visit which showed a trace of hematuria.  Patient's urine will be sent for culture and patient will be notified if any further treatment is needed based on the results.  Otherwise, patient will have repeat UA done at her 2-week follow-up and if continued trace hematuria, patient may be referred to urology for further evaluation.  Patient reports that she is not currently on her menses and has not recently completed her menses. - POCT URINALYSIS DIP (CLINITEK)  6. Change in bowel habits Patient with a complaint of a recent change in her bowel habits, will check thyroid labs to see if hypothyroidism may be playing a role in her bowel changes.  Patient will be reevaluated a 2-week follow-up and may need GI referral for further evaluation and treatment. - TSH + free T4  7. Fatigue, unspecified type Patient with complaint of recent onset of fatigue and patient will have CBC, CMP and thyroid blood test in follow-up of her fatigue.  Patient denies any possibility of pregnancy at this time. - CBC with Differential - Comprehensive metabolic panel - TSH + free T4  8. Nasal congestion Patient with complaint of nasal congestion as well as loss of sense of smell but states that she is being evaluated by ear nose and throat  9. Abnormal urinalysis Patient with abnormal findings on urinalysis of trace hematuria and urine will be sent for culture.  Patient will be notified if further treatment is needed based on the culture results.  Patient otherwise will have repeat UA at her next visit to make sure the hematuria has resolved - Urine Culture  *Patient was offered influenza immunization at today's visit but states that she has already received influenza immunization  An After Visit Summary was printed and given to the patient. Return in about 2 weeks (around 11/11/2017) for abdominal pain.

## 2017-10-29 LAB — CBC WITH DIFFERENTIAL/PLATELET
Basophils Absolute: 0 x10E3/uL (ref 0.0–0.2)
Basos: 0 %
EOS (ABSOLUTE): 0.8 x10E3/uL — ABNORMAL HIGH (ref 0.0–0.4)
Eos: 8 %
Hematocrit: 40.2 % (ref 34.0–46.6)
Hemoglobin: 12.9 g/dL (ref 11.1–15.9)
Immature Grans (Abs): 0 x10E3/uL (ref 0.0–0.1)
Immature Granulocytes: 0 %
Lymphocytes Absolute: 4 x10E3/uL — ABNORMAL HIGH (ref 0.7–3.1)
Lymphs: 38 %
MCH: 24.1 pg — ABNORMAL LOW (ref 26.6–33.0)
MCHC: 32.1 g/dL (ref 31.5–35.7)
MCV: 75 fL — ABNORMAL LOW (ref 79–97)
Monocytes Absolute: 0.6 x10E3/uL (ref 0.1–0.9)
Monocytes: 6 %
Neutrophils Absolute: 5.1 x10E3/uL (ref 1.4–7.0)
Neutrophils: 48 %
Platelets: 281 x10E3/uL (ref 150–450)
RBC: 5.35 x10E6/uL — ABNORMAL HIGH (ref 3.77–5.28)
RDW: 16.2 % — ABNORMAL HIGH (ref 12.3–15.4)
WBC: 10.5 x10E3/uL (ref 3.4–10.8)

## 2017-10-29 LAB — COMPREHENSIVE METABOLIC PANEL WITH GFR
ALT: 6 IU/L (ref 0–32)
AST: 8 IU/L (ref 0–40)
Albumin/Globulin Ratio: 1.7 (ref 1.2–2.2)
Albumin: 3.2 g/dL — ABNORMAL LOW (ref 3.5–5.5)
Alkaline Phosphatase: 39 IU/L (ref 39–117)
BUN/Creatinine Ratio: 10 (ref 9–23)
BUN: 8 mg/dL (ref 6–20)
Bilirubin Total: <0.2 mg/dL (ref 0.0–1.2)
CO2: 25 mmol/L (ref 20–29)
Calcium: 8.8 mg/dL (ref 8.7–10.2)
Chloride: 105 mmol/L (ref 96–106)
Creatinine, Ser: 0.83 mg/dL (ref 0.57–1.00)
GFR calc Af Amer: 109 mL/min/1.73
GFR calc non Af Amer: 94 mL/min/1.73
Globulin, Total: 1.9 g/dL (ref 1.5–4.5)
Glucose: 89 mg/dL (ref 65–99)
Potassium: 4.4 mmol/L (ref 3.5–5.2)
Sodium: 140 mmol/L (ref 134–144)
Total Protein: 5.1 g/dL — ABNORMAL LOW (ref 6.0–8.5)

## 2017-10-29 LAB — TSH+FREE T4
Free T4: 0.88 ng/dL (ref 0.82–1.77)
TSH: 1.1 u[IU]/mL (ref 0.450–4.500)

## 2017-10-29 LAB — LIPASE: Lipase: 17 U/L (ref 14–72)

## 2017-10-30 LAB — URINE CULTURE

## 2017-10-30 LAB — H. PYLORI BREATH TEST: H pylori Breath Test: NEGATIVE

## 2017-11-01 ENCOUNTER — Telehealth: Payer: Self-pay | Admitting: *Deleted

## 2017-11-01 NOTE — Telephone Encounter (Signed)
Patient verified DOB Patient is aware of h pylori being negative and no infection or bacteria being noted in the urine. Patient will follow up as planned.

## 2017-11-01 NOTE — Telephone Encounter (Signed)
-----   Message from Antony Blackbird, MD sent at 10/31/2017  4:12 PM EDT ----- Please notify patient that her test for a bacteria that can cause stomach ulcers was negative. Patient also did not have the growth of any bacteria on her urine culture that would require treatment with an antibiotic

## 2017-11-11 ENCOUNTER — Ambulatory Visit: Payer: Medicaid Other | Admitting: Family Medicine

## 2017-11-14 ENCOUNTER — Ambulatory Visit (HOSPITAL_COMMUNITY): Payer: Medicaid Other

## 2017-12-16 ENCOUNTER — Ambulatory Visit: Payer: Medicaid Other | Admitting: Neurology

## 2017-12-16 ENCOUNTER — Other Ambulatory Visit: Payer: Self-pay

## 2017-12-16 ENCOUNTER — Encounter: Payer: Self-pay | Admitting: Neurology

## 2017-12-16 VITALS — BP 123/79 | HR 94 | Resp 14 | Ht 65.0 in | Wt 156.0 lb

## 2017-12-16 DIAGNOSIS — E538 Deficiency of other specified B group vitamins: Secondary | ICD-10-CM

## 2017-12-16 DIAGNOSIS — R430 Anosmia: Secondary | ICD-10-CM | POA: Diagnosis not present

## 2017-12-16 HISTORY — DX: Anosmia: R43.0

## 2017-12-16 NOTE — Progress Notes (Signed)
Reason for visit: Anosmia  Referring physician: Dr. Nelly Laurence is a 32 y.o. 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  The patient indicates that the problem began around the time when she was having significant issues with sinus infections.  When the sinus infection finally cleared, her sense of smell was significantly impacted.  This has never recovered.  The patient is able to taste basic sensations such as sweet, sour, bitter, and salty.  The patient cannot detect the flavor of foods, she cannot smell smoke.  She does not smoke cigarettes, she does not use other drugs such as marijuana or cocaine.  She denies any significant issues with headaches, dizziness, vision changes, or speech or swallowing problems.  She denies any numbness of the arms or legs with exception of some occasional tingling in the finger tips.  She denies any balance problems or weakness.  She has some issues with constipation but denies issues controlling the bladder.  She is sent to this office for an evaluation.  She denies any sinus allergy symptoms.  A CT scan of the brain done relatively recently did not show any sinus disease.  This was done without contrast only.  Past Medical History:  Diagnosis Date  . 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 CESAREAN SECTION      Family History  Problem Relation Age of Onset  . Diabetes Mother   . Hypertension Mother   . Healthy Father   . Hypertension Maternal Grandmother   . Peripheral vascular disease Maternal Grandmother   . Stroke Maternal Grandfather   . Hypertension Brother   . Vasculitis Brother   . CAD Neg Hx   . CVA Neg Hx   . Cancer Neg Hx     Social history:  reports that she has never smoked. She has never used smokeless tobacco. She reports that she does not drink alcohol or use drugs.  Medications:  Prior to  Admission medications   Medication Sig Start Date End Date Taking? Authorizing Provider  dicyclomine (BENTYL) 20 MG tablet Take 1 tablet (20 mg total) by mouth 2 (two) times daily. 06/15/14   Waynetta Pean, PA-C  ondansetron (ZOFRAN ODT) 4 MG disintegrating tablet Take 1 tablet (4 mg total) by mouth every 8 (eight) hours as needed for nausea or vomiting. 06/15/14   Waynetta Pean, PA-C  pantoprazole (PROTONIX) 40 MG tablet Take 1 tablet (40 mg total) by mouth 2 (two) times daily. To reduce stomach acid 10/28/17   Fulp, Cammie, MD  polyethylene glycol powder (GLYCOLAX/MIRALAX) powder Take 17 g by mouth 2 (two) times daily as needed. Start with once per day and increase to twice daily if needed 10/28/17   Fulp, Cammie, MD     No Known Allergies  ROS:  Out of a complete 14 system review of symptoms, the patient complains only of the following symptoms, and all other reviewed systems are negative.  Anosmia  Blood pressure 123/79, pulse 94, resp. rate 14, height 5\' 5"  (1.651 m), weight 156 lb (70.8 kg), unknown if currently breastfeeding.  Physical Exam  General: The patient is alert and cooperative at the time of the examination.  Eyes: Pupils are equal, round, and reactive to light. Discs are flat bilaterally.  Neck: The neck is supple, no carotid bruits are noted.  Respiratory: The respiratory examination is clear.  Cardiovascular: The  cardiovascular examination reveals a regular rate and rhythm, no obvious murmurs or rubs are noted.  Skin: Extremities are without significant edema.  Neurologic Exam  Mental status: The patient is alert and oriented x 3 at the time of the examination. The patient has apparent normal recent and remote memory, with an apparently normal attention span and concentration ability.  Cranial nerves: Facial symmetry is present. There is good sensation of the face to pinprick and soft touch bilaterally. The strength of the facial muscles and the muscles to head  turning and shoulder shrug are normal bilaterally. Speech is well enunciated, no aphasia or dysarthria is noted. Extraocular movements are full. Visual fields are full. The tongue is midline, and the patient has symmetric elevation of the soft palate. No obvious hearing deficits are noted.  Motor: The motor testing reveals 5 over 5 strength of all 4 extremities. Good symmetric motor tone is noted throughout.  Sensory: Sensory testing is intact to pinprick, soft touch, vibration sensation, and position sense on all 4 extremities. No evidence of extinction is noted.  Coordination: Cerebellar testing reveals good finger-nose-finger and heel-to-shin bilaterally.  Gait and station: Gait is normal. Tandem gait is normal. Romberg is negative. No drift is seen.  Reflexes: Deep tendon reflexes are symmetric and normal bilaterally. Toes are downgoing bilaterally.   CT head 08/19/17:  IMPRESSION: No acute intracranial abnormality. Mild brain parenchymal volume loss, unusual for patient's age. No abnormalities within the paranasal sinuses.  * CT scan images were reviewed online. I agree with the written report.    Assessment/Plan:  1.  Anosmia  The patient likely has had alteration of her sense of smell and taste as a result of a severe sinus infection.  The patient has had alteration for 2 years, it is unlikely that her sense of smell will return.  We will check further blood work, the patient has already had a thyroid study revealing a TSH of 1.1 with a normal free T4 level.  The patient will have MRI of the brain with and without gadolinium enhancement as well.  She will follow-up through this office if needed. She will consider going on a multivitamin containing zinc.  Wanda Alexanders MD 12/16/2017 8:23 AM  [facility] [address] [place], [place] [address]  Phone [phone] Fax [phone]

## 2017-12-18 ENCOUNTER — Telehealth: Payer: Self-pay | Admitting: *Deleted

## 2017-12-18 LAB — ZINC: ZINC: 77 ug/dL (ref 56–134)

## 2017-12-18 LAB — B. BURGDORFI ANTIBODIES

## 2017-12-18 LAB — HEMOGLOBIN A1C
ESTIMATED AVERAGE GLUCOSE: 117 mg/dL
Hgb A1c MFr Bld: 5.7 % — ABNORMAL HIGH (ref 4.8–5.6)

## 2017-12-18 LAB — BASIC METABOLIC PANEL
BUN/Creatinine Ratio: 9 (ref 9–23)
BUN: 8 mg/dL (ref 6–20)
CHLORIDE: 105 mmol/L (ref 96–106)
CO2: 24 mmol/L (ref 20–29)
Calcium: 8.5 mg/dL — ABNORMAL LOW (ref 8.7–10.2)
Creatinine, Ser: 0.85 mg/dL (ref 0.57–1.00)
GFR calc Af Amer: 105 mL/min/{1.73_m2} (ref >59)
GFR, EST NON AFRICAN AMERICAN: 91 mL/min/{1.73_m2} (ref >59)
Glucose: 103 mg/dL — ABNORMAL HIGH (ref 65–99)
POTASSIUM: 4 mmol/L (ref 3.5–5.2)
Sodium: 142 mmol/L (ref 134–144)

## 2017-12-18 LAB — VITAMIN B12: VITAMIN B 12: 386 pg/mL (ref 232–1245)

## 2017-12-18 LAB — ANGIOTENSIN CONVERTING ENZYME: Angio Convert Enzyme: 25 U/L (ref 14–82)

## 2017-12-18 LAB — ANA W/REFLEX: Anti Nuclear Antibody(ANA): NEGATIVE

## 2017-12-18 LAB — SEDIMENTATION RATE: Sed Rate: 2 mm/hr (ref 0–32)

## 2017-12-23 ENCOUNTER — Telehealth: Payer: Self-pay | Admitting: *Deleted

## 2017-12-23 NOTE — Telephone Encounter (Signed)
-----   Message from Kathrynn Ducking, MD sent at 12/18/2017  7:26 AM EST -----  The blood work results are unremarkable, with exception of a minimal elevation of the Hgb A1c, suggestive of borderline diabetes. Please call the patient. ----- Message ----- From: Lavone Neri Lab Results In Sent: 12/17/2017   7:39 AM EST To: Kathrynn Ducking, MD

## 2017-12-23 NOTE — Telephone Encounter (Signed)
LMOM with below lab results and requested pt. speak to pcp about minimally elevated Hgb A1C (5.7)  to see if this is significant.  She does not need to return this call unless she has questions/fim

## 2017-12-23 NOTE — Telephone Encounter (Signed)
Spoke to pt and relayed that her labs unremarkable, with exception of slight elevation of Hbg A1C.  (relayed the exact numbers to her).    She stated she did not have pcp.  (Dr. Chapman Fitch, referring MD?  No actually Dr. Sallee Provencal).  She verbalized understanding.

## 2017-12-23 NOTE — Telephone Encounter (Signed)
Patient called back and requested a return call. Please [phone].

## 2017-12-27 ENCOUNTER — Other Ambulatory Visit: Payer: Medicaid Other

## 2018-01-05 ENCOUNTER — Inpatient Hospital Stay: Admission: RE | Admit: 2018-01-05 | Payer: Medicaid Other | Source: Ambulatory Visit

## 2018-02-10 ENCOUNTER — Ambulatory Visit: Payer: Medicaid Other | Admitting: Family Medicine

## 2018-05-28 ENCOUNTER — Other Ambulatory Visit: Payer: Self-pay

## 2018-05-28 ENCOUNTER — Ambulatory Visit: Payer: Medicaid Other | Attending: Family Medicine | Admitting: Family Medicine

## 2018-05-28 ENCOUNTER — Encounter: Payer: Self-pay | Admitting: Family Medicine

## 2018-05-28 DIAGNOSIS — R197 Diarrhea, unspecified: Secondary | ICD-10-CM | POA: Diagnosis not present

## 2018-05-28 DIAGNOSIS — R42 Dizziness and giddiness: Secondary | ICD-10-CM | POA: Diagnosis not present

## 2018-05-28 DIAGNOSIS — R5383 Other fatigue: Secondary | ICD-10-CM

## 2018-05-28 DIAGNOSIS — R7303 Prediabetes: Secondary | ICD-10-CM

## 2018-05-28 DIAGNOSIS — G4700 Insomnia, unspecified: Secondary | ICD-10-CM

## 2018-05-28 DIAGNOSIS — F419 Anxiety disorder, unspecified: Secondary | ICD-10-CM

## 2018-05-28 MED ORDER — TRAZODONE HCL 50 MG PO TABS: 25.0000 mg | ORAL_TABLET | Freq: Every evening | ORAL | 3 refills | Status: DC | PRN

## 2018-05-28 NOTE — Progress Notes (Signed)
Per pt a few months ago she got blood work done and they told her she was pre-diabetic  Dizziness all the time  Fatigue and can't get out of bed  Wanted to get confirmation that she was pre-diabetic because theres something wrong  Loss of appetite and have not been eating and per pt she is always dehydrated.   Per pt she is bruising easily   Per patient she would also like her thyroids checked as well.   Diarrhea for about a week now

## 2018-05-29 ENCOUNTER — Other Ambulatory Visit: Payer: Self-pay | Admitting: Gastroenterology

## 2018-05-29 ENCOUNTER — Ambulatory Visit: Payer: Medicaid Other | Attending: Family Medicine

## 2018-05-30 ENCOUNTER — Other Ambulatory Visit: Payer: Self-pay | Admitting: Family Medicine

## 2018-05-30 DIAGNOSIS — E559 Vitamin D deficiency, unspecified: Secondary | ICD-10-CM

## 2018-05-30 LAB — COMPREHENSIVE METABOLIC PANEL WITH GFR
ALT: 8 IU/L (ref 0–32)
AST: 12 IU/L (ref 0–40)
Albumin/Globulin Ratio: 1.6 (ref 1.2–2.2)
Albumin: 4.1 g/dL (ref 3.8–4.8)
Alkaline Phosphatase: 39 IU/L (ref 39–117)
BUN/Creatinine Ratio: 14 (ref 9–23)
BUN: 9 mg/dL (ref 6–20)
Bilirubin Total: <0.2 mg/dL (ref 0.0–1.2)
CO2: 21 mmol/L (ref 20–29)
Calcium: 9.1 mg/dL (ref 8.7–10.2)
Chloride: 106 mmol/L (ref 96–106)
Creatinine, Ser: 0.64 mg/dL (ref 0.57–1.00)
GFR calc Af Amer: 137 mL/min/1.73
GFR calc non Af Amer: 118 mL/min/1.73
Globulin, Total: 2.5 g/dL (ref 1.5–4.5)
Glucose: 96 mg/dL (ref 65–99)
Potassium: 4.1 mmol/L (ref 3.5–5.2)
Sodium: 137 mmol/L (ref 134–144)
Total Protein: 6.6 g/dL (ref 6.0–8.5)

## 2018-05-30 LAB — HEMOGLOBIN A1C
Est. average glucose Bld gHb Est-mCnc: 111 mg/dL
Hgb A1c MFr Bld: 5.5 % (ref 4.8–5.6)

## 2018-05-30 LAB — CBC WITH DIFFERENTIAL/PLATELET
Basophils Absolute: 0 x10E3/uL (ref 0.0–0.2)
Basos: 1 %
EOS (ABSOLUTE): 0.5 x10E3/uL — ABNORMAL HIGH (ref 0.0–0.4)
Eos: 5 %
Hematocrit: 35.6 % (ref 34.0–46.6)
Hemoglobin: 11.1 g/dL (ref 11.1–15.9)
Immature Grans (Abs): 0 x10E3/uL (ref 0.0–0.1)
Immature Granulocytes: 0 %
Lymphocytes Absolute: 2.8 x10E3/uL (ref 0.7–3.1)
Lymphs: 32 %
MCH: 22.7 pg — ABNORMAL LOW (ref 26.6–33.0)
MCHC: 31.2 g/dL — ABNORMAL LOW (ref 31.5–35.7)
MCV: 73 fL — ABNORMAL LOW (ref 79–97)
Monocytes Absolute: 0.6 x10E3/uL (ref 0.1–0.9)
Monocytes: 7 %
Neutrophils Absolute: 4.8 x10E3/uL (ref 1.4–7.0)
Neutrophils: 55 %
Platelets: 261 x10E3/uL (ref 150–450)
RBC: 4.89 x10E6/uL (ref 3.77–5.28)
RDW: 15.7 % — ABNORMAL HIGH (ref 11.7–15.4)
WBC: 8.7 x10E3/uL (ref 3.4–10.8)

## 2018-05-30 LAB — T4 AND TSH
T4, Total: 6.2 ug/dL (ref 4.5–12.0)
TSH: 1.17 u[IU]/mL (ref 0.450–4.500)

## 2018-05-30 LAB — VITAMIN D 25 HYDROXY (VIT D DEFICIENCY, FRACTURES): Vit D, 25-Hydroxy: 10.2 ng/mL — ABNORMAL LOW (ref 30.0–100.0)

## 2018-05-30 MED ORDER — VITAMIN D (ERGOCALCIFEROL) 1.25 MG (50000 UNIT) PO CAPS: 50000.0000 [IU] | ORAL_CAPSULE | ORAL | 0 refills | Status: DC

## 2018-05-30 NOTE — Progress Notes (Signed)
Patient ID: Wanda Nunez, female   DOB: 28-Dec-1985, 33 y.o.   MRN: [id number]   Patient with low Vitamin D level on recent labs consistent with Vitamin D deficiency and she will be notified that RX for Vitamin D replacement therapy has been sent into her pharmacy.

## 2018-05-31 ENCOUNTER — Encounter: Payer: Self-pay | Admitting: Family Medicine

## 2018-05-31 NOTE — Progress Notes (Signed)
Virtual Visit via Telephone Note  I connected with Wanda Nunez on 05/31/18 at 10:30 AM EDT by telephone and verified that I am speaking with the correct person using two identifiers.   I discussed the limitations, risks, security and privacy concerns of performing an evaluation and management service by telephone and the availability of in person appointments. I also discussed with the patient that there may be a patient responsible charge related to this service. The patient expressed understanding and agreed to proceed.  Patient Location: Home Provider Location: Office Others participating in call: Emilio Aspen, RMA   History of Present Illness:     33 year old female who reports that recently she is felt extremely fatigued as if she cannot get out of bed.  She believes her symptoms may have been going on for 3 to 4 weeks.  She feels that she is always dehydrated and drinks at least 3 L of water daily.  She also has recurrent episodes of feeling dizzy which can occur randomly.  She states that she had blood work done a few months ago when she saw neurology regarding her loss of sense of smell and was told that she was prediabetic.  Patient states that she would like to have blood work repeated regarding her prediabetes that she feels that there must be something very wrong with her due to her fatigue.  She reports that she is now also feeling very anxious and having issues with inability to sleep.  Patient has inability both to fall asleep as well as issues with awakening from sleep during the night.  She denies any suicidal thoughts or ideations.  She has also had diarrhea for about 1 week.  She has also noticed some blood on the toilet paper after wiping and has seen some blood in the toilet but she has had no dark stools and no blood mixed in the stool or along the side of the stool.  She has had no fever or chills, no cough or shortness of breath.  She would like to have blood work  including check of thyroid hormones, follow-up of her prediabetes as well as a check of her blood count as she believes that she was anemic in the past.  Patient would like multiple test that she feels that there is something wrong with her because of her extreme fatigue.  She denies any peripheral edema, no joint pain.  She does feel at times as if she has muscle aches.   Past Medical History:  Diagnosis Date  . 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 CESAREAN SECTION      Family History  Problem Relation Age of Onset  . Diabetes Mother   . Hypertension Mother   . Healthy Father   . Hypertension Maternal Grandmother   . Peripheral vascular disease Maternal Grandmother   . Stroke Maternal Grandfather   . Hypertension Brother   . Vasculitis Brother   . CAD Neg Hx   . CVA Neg Hx   . Cancer Neg Hx     Social History   Tobacco Use  . Smoking status: Never Smoker  . Smokeless tobacco: Never Used  Substance Use Topics  . Alcohol use: No  . Drug use: No     No Known Allergies   Review of Systems  Constitutional: Positive for malaise/fatigue. Negative for chills and fever.  HENT: Negative for congestion and sore throat.   Eyes: Negative for blurred vision  and double vision.  Respiratory: Negative for cough and shortness of breath.   Cardiovascular: Negative for chest pain and palpitations.  Gastrointestinal: Positive for abdominal pain and diarrhea. Negative for blood in stool, constipation, heartburn, melena, nausea and vomiting.       Has seen blood with wiping and blood in the toilet after bowel movement.  Diarrhea x1 week.  Genitourinary: Negative for dysuria and frequency.  Musculoskeletal: Positive for myalgias. Negative for joint pain.  Skin: Negative for itching and rash.  Neurological: Positive for dizziness. Negative for headaches.  Endo/Heme/Allergies: Negative for polydipsia. Bruises/bleeds easily.  Psychiatric/Behavioral: The  patient is nervous/anxious and has insomnia.      Observations/Objective: No vital signs or physical exam conducted as visit was done via telephone due to current restrictions/limitations on office visits secondary to the current COVID-19 pandemic  Assessment and Plan: 1. Fatigue, unspecified type She reports that she has had onset of excessive fatigue for a few weeks and feels that there must be something very wrong with her and she would like multiple blood test ordered.  She reports that she was told in November that she had prediabetes and would like blood work for follow-up.  She would also like to have her thyroid hormones rechecked and a complete blood count.  Discussed with the patient that a CMP would also be done due to her current issues with diarrhea.  Vitamin D level will be obtained due to her fatigue and muscle aches.  Patient will be notified of the results and if any further treatment is needed based on these results.  She denies depression but does believe that she has anxiety and I discussed that this could be contributed to her fatigue as well as the fact that she is having issues with sleep which may also be anxiety related. - CBC with Differential; Future - T4 AND TSH; Future - Comprehensive metabolic panel; Future - Hemoglobin A1c; Future - Vitamin D, 25-hydroxy; Future  2. Diarrhea, unspecified type She is encouraged to increase dietary fiber as well as try over-the-counter Imodium to help with her current diarrhea.  Patient will have CMP in follow-up of her diarrhea and will also be referred to gastroenterology for further evaluation and treatment. - Ambulatory referral to Gastroenterology - Comprehensive metabolic panel; Future  3. Dizziness Patient reports issues with dizziness.  She has had recent neurologic work-up in November regarding loss of sense of smell.  MRI of the brain was ordered which does not appear to have been done by the patient.  She is encouraged to  contact neurology if she has continued dizziness.  She will also have lab work to look for possible causes of her dizziness including anemia, dehydration or electrolyte abnormality. - CBC with Differential; Future - Comprehensive metabolic panel; Future  4. Prediabetes On review of chart, hemoglobin A1c was 5.7 in November of last year on labs done by neurology.  Patient will come in for lab visit and hemoglobin A1c will be repeated.  She is not sure if she can come in today or if she will need to wait until tomorrow for lab work. - Hemoglobin A1c; Future - Hemoglobin A1c  5. Insomnia, unspecified type Discussed with the patient that her insomnia might be related to anxiety.  Sleep hygiene reviewed with the patient.  Prescription provided for trazodone to take one half or 1 pill about 30 minutes or less prior to bedtime to help with sleep.  Trazodone (DESYREL) 50 MG tablet; Take 0.5-1 tablets (25-50  mg total) by mouth at bedtime as needed for sleep.  Dispense: 30 tablet; Refill: 3  6. Anxiety She admits to having some anxiety.  Discussed with the patient that the anxiety may be contributing to her insomnia and fatigue.  She agrees to referral to social worker to discuss the possible need for counseling regarding her anxiety.  She will also start the use of trazodone at bedtime which I discussed with the patient can help with her anxiety as well as insomnia.  Patient has been asked to follow-up in office in approximately 2 weeks or sooner if needed. - Ambulatory referral to Social Work - traZODone (DESYREL) 50 MG tablet; Take 0.5-1 tablets (25-50 mg total) by mouth at bedtime as needed for sleep.  Dispense: 30 tablet; Refill: 3 - T4 AND TSH; Future    Follow Up Instructions:Return in about 2 weeks (around 06/11/2018) for multiple issues- lab visit;.    I discussed the assessment and treatment plan with the patient. The patient was provided an opportunity to ask questions and all were answered.  The patient agreed with the plan and demonstrated an understanding of the instructions.   The patient was advised to call back or seek an in-person evaluation if the symptoms worsen or if the condition fails to improve as anticipated.  I provided 26 minutes of non-face-to-face time during this encounter.   Antony Blackbird, MD

## 2018-06-03 ENCOUNTER — Telehealth: Payer: Self-pay | Admitting: Family Medicine

## 2018-06-03 NOTE — Telephone Encounter (Signed)
Patient called to get their lab results please follow up °

## 2018-06-05 NOTE — Telephone Encounter (Signed)
Spoke with patient and informed her with lab results. Patient verbalized understanding.

## 2018-07-31 ENCOUNTER — Other Ambulatory Visit: Payer: Self-pay

## 2018-07-31 ENCOUNTER — Ambulatory Visit (HOSPITAL_COMMUNITY)
Admission: EM | Admit: 2018-07-31 | Discharge: 2018-07-31 | Disposition: A | Discharge status: Home / Self Care | Payer: Medicaid Other | Attending: Emergency Medicine | Admitting: Emergency Medicine

## 2018-07-31 ENCOUNTER — Encounter (HOSPITAL_COMMUNITY): Payer: Self-pay | Admitting: Emergency Medicine

## 2018-07-31 ENCOUNTER — Ambulatory Visit (INDEPENDENT_AMBULATORY_CARE_PROVIDER_SITE_OTHER): Payer: Medicaid Other

## 2018-07-31 DIAGNOSIS — S63642A Sprain of metacarpophalangeal joint of left thumb, initial encounter: Secondary | ICD-10-CM

## 2018-07-31 MED ORDER — KETOROLAC TROMETHAMINE 30 MG/ML IJ SOLN
30.0000 mg | Freq: Once | INTRAMUSCULAR | Status: AC
  Administered 2018-07-31: 30 mg via INTRAMUSCULAR

## 2018-07-31 MED ORDER — KETOROLAC TROMETHAMINE 30 MG/ML IJ SOLN
INTRAMUSCULAR | Status: AC
  Filled 2018-07-31: qty 1

## 2018-07-31 NOTE — Discharge Instructions (Addendum)
Take Tylenol or ibuprofen as needed for pain. May use ice, compression for additional swelling and pain relief. May use thumb spica splint for additional support. Follow-up with Ortho within 1 week's time if pain, swelling still persisting for further evaluation/management. Return if you develop worsening pain, swelling, numbness.

## 2018-07-31 NOTE — ED Triage Notes (Signed)
Pt sts left hand and thumb pain after injuring last night playing with son

## 2018-07-31 NOTE — ED Provider Notes (Signed)
[facility]    CSN: [id number] Arrival date & time: 07/31/18  1015     History   Chief Complaint Chief Complaint  Patient presents with  . Hand Pain    HPI Wanda Nunez is a 33 y.o. female presenting for acute concern of left thumb pain.  Patient states that she was playing with her son last night when she went to go to call and he kicked her with his foot.  Patient denies pop/snap/tearing sensation.  Patient is endorsing swelling, pain, decreased range of motion.  Has had some intermittent tingling, though denies decreased incision.     Past Medical History:  Diagnosis Date  . 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 CESAREAN SECTION      OB History    Gravida  1   Para      Term      Preterm      AB      Living        SAB      TAB      Ectopic      Multiple      Live Births               Home Medications    Prior to Admission medications   Medication Sig Start Date End Date Taking? Authorizing Provider  pantoprazole (PROTONIX) 40 MG tablet Take 1 tablet (40 mg total) by mouth 2 (two) times daily. To reduce stomach acid 10/28/17   Fulp, Cammie, MD  traZODone (DESYREL) 50 MG tablet Take 0.5-1 tablets (25-50 mg total) by mouth at bedtime as needed for sleep. 05/28/18   Fulp, Cammie, MD  Vitamin D, Ergocalciferol, (DRISDOL) 1.25 MG (50000 UT) CAPS capsule Take 1 capsule (50,000 Units total) by mouth every 7 (seven) days. 05/30/18   Antony Blackbird, MD    Family History Family History  Problem Relation Age of Onset  . Diabetes Mother   . Hypertension Mother   . Healthy Father   . Hypertension Maternal Grandmother   . Peripheral vascular disease Maternal Grandmother   . Stroke Maternal Grandfather   . Hypertension Brother   . Vasculitis Brother   . CAD Neg Hx   . CVA Neg Hx   . Cancer Neg Hx     Social History Social  History   Tobacco Use  . Smoking status: Never Smoker  . Smokeless tobacco: Never Used  Substance Use Topics  . Alcohol use: No  . Drug use: No     Allergies   Patient has no known allergies.   Review of Systems As per HPI   Physical Exam Triage Vital Signs ED Triage Vitals [07/31/18 1036]  Enc Vitals Group     BP 129/89     Pulse Rate 89     Resp 18     Temp 98.4 F (36.9 C)     Temp Source Oral     SpO2 98 %     Weight      Height      Head Circumference      Peak Flow      Pain Score 8     Pain Loc      Pain Edu?      Excl. in [place]?    No data found.  Updated Vital Signs BP 129/89 (BP Location: Right Arm)  Pulse 89   Temp 98.4 F (36.9 C) (Oral)   Resp 18   LMP 07/25/2018 (Exact Date)   SpO2 98%   Visual Acuity Right Eye Distance:   Left Eye Distance:   Bilateral Distance:    Right Eye Near:   Left Eye Near:    Bilateral Near:     Physical Exam Constitutional:      General: She is not in acute distress. HENT:     Head: Normocephalic and atraumatic.  Eyes:     General: No scleral icterus.    Pupils: Pupils are equal, round, and reactive to light.  Cardiovascular:     Rate and Rhythm: Normal rate.  Pulmonary:     Effort: Pulmonary effort is normal.  Musculoskeletal:     Comments: Swelling of left hand from wrist to distal thumb, including index finger.  Bony tenderness in first and second MCP as well as distal radial head.  Snuffbox tenderness.  Decreased range of motion of left wrist, first and second digit.  Strength decreased as well. NVI.  Neurological:     Mental Status: She is alert and oriented to person, place, and time.      UC Treatments / Results  Labs (all labs ordered are listed, but only abnormal results are displayed) Labs Reviewed - No data to display  EKG   Radiology Dg Hand Complete Left  Result Date: 07/31/2018 CLINICAL DATA:  Status post injury, pain EXAM: LEFT HAND - COMPLETE 3+ VIEW COMPARISON:  None.  FINDINGS: There is no evidence of fracture or dislocation. There is no evidence of arthropathy or other focal bone abnormality. Soft tissues are unremarkable. IMPRESSION: No acute osseous injury of the left hand. Electronically Signed   By: Kathreen Devoid   On: 07/31/2018 11:21    Procedures Procedures (including critical care time)  Medications Ordered in UC Medications  ketorolac (TORADOL) 30 MG/ML injection 30 mg (has no administration in time range)    Initial Impression / Assessment and Plan / UC Course  I have reviewed the triage vital signs and the nursing notes.  Pertinent labs & imaging results that were available during my care of the patient were reviewed by me and considered in my medical decision making (see chart for details).     33 year old female presenting for left thumb/wrist pain status post injury.  Of concern is neurovascularly intact, low concern for compression syndrome.  X-ray done in office, reviewed by radiologist and myself: No acute osseous injury of the left hand.  Thumb spica applied in office and patient given Toradol injection which she tolerated both well.  Instructed to follow-up with Ortho should symptoms not improve/worsen.  Return precautions discussed, patient verbalized understanding and is agreeable to plan. Final Clinical Impressions(s) / UC Diagnoses   Final diagnoses:  Sprain of metacarpophalangeal (MCP) joint of left thumb, initial encounter     Discharge Instructions     Take Tylenol or ibuprofen as needed for pain. May use ice, compression for additional swelling and pain relief. May use thumb spica splint for additional support. Follow-up with Ortho within 1 week's time if pain, swelling still persisting for further evaluation/management. Return if you develop worsening pain, swelling, numbness.    ED Prescriptions    None     Controlled Substance Prescriptions [place] Controlled Substance Registry consulted? Not Applicable    Quincy Sheehan, [place] 07/31/18 1146

## 2018-08-04 ENCOUNTER — Telehealth: Payer: Self-pay | Admitting: Licensed Clinical Social Worker

## 2018-08-04 NOTE — Telephone Encounter (Signed)
Call placed to patient. LCSW introduced self and explained role at [facility]. Pt was informed of consult to address behavioral health and/or resource needs.   Pt reports an increase in anxiety symptoms, including, racing thoughts, difficulty sleeping, and difficulty concentrating on tasks. She is currently taking prescribed medication (trazedone) to assist with sleeping; however, reports limited effectiveness.   LCSW inquired about additional coping skills. Pt has downloaded an app to assist with meditation and has purchased a journal to assist with managing mental health.   Pt is open to referral to [facility] to initiate medication management and counseling.   LCSW completed referral and faxed to [phone]

## 2018-12-31 ENCOUNTER — Other Ambulatory Visit: Payer: Self-pay | Admitting: Gastroenterology

## 2019-01-01 ENCOUNTER — Other Ambulatory Visit (HOSPITAL_COMMUNITY): Payer: Self-pay | Admitting: Gastroenterology

## 2019-01-01 ENCOUNTER — Other Ambulatory Visit: Payer: Self-pay | Admitting: Gastroenterology

## 2019-01-01 DIAGNOSIS — R196 Halitosis: Secondary | ICD-10-CM

## 2019-01-13 ENCOUNTER — Encounter (HOSPITAL_COMMUNITY): Payer: Self-pay

## 2019-01-13 ENCOUNTER — Ambulatory Visit (HOSPITAL_COMMUNITY): Admission: RE | Admit: 2019-01-13 | Payer: Medicaid Other | Source: Ambulatory Visit

## 2019-01-21 ENCOUNTER — Encounter (HOSPITAL_COMMUNITY): Admission: RE | Payer: Self-pay | Source: Home / Self Care

## 2019-01-21 ENCOUNTER — Ambulatory Visit (HOSPITAL_COMMUNITY): Admission: RE | Admit: 2019-01-21 | Payer: Medicaid Other | Source: Home / Self Care | Admitting: Gastroenterology

## 2019-01-21 SURGERY — MANOMETRY, ESOPHAGUS

## 2019-03-03 ENCOUNTER — Other Ambulatory Visit: Payer: Self-pay | Admitting: Gastroenterology

## 2019-03-03 DIAGNOSIS — R109 Unspecified abdominal pain: Secondary | ICD-10-CM

## 2019-03-06 ENCOUNTER — Other Ambulatory Visit: Payer: Self-pay | Admitting: Gastroenterology

## 2019-03-06 DIAGNOSIS — R109 Unspecified abdominal pain: Secondary | ICD-10-CM

## 2019-03-16 ENCOUNTER — Other Ambulatory Visit: Payer: Medicaid Other

## 2019-03-18 ENCOUNTER — Ambulatory Visit
Admission: RE | Admit: 2019-03-18 | Discharge: 2019-03-18 | Disposition: A | Discharge status: Home / Self Care | Payer: Medicaid Other | Source: Ambulatory Visit | Attending: Gastroenterology | Admitting: Gastroenterology

## 2019-03-18 DIAGNOSIS — R109 Unspecified abdominal pain: Secondary | ICD-10-CM

## 2019-03-19 ENCOUNTER — Ambulatory Visit (HOSPITAL_COMMUNITY)
Admission: EM | Admit: 2019-03-19 | Discharge: 2019-03-19 | Disposition: A | Discharge status: Home / Self Care | Payer: Medicaid Other | Attending: Family Medicine | Admitting: Family Medicine

## 2019-03-19 ENCOUNTER — Encounter (HOSPITAL_COMMUNITY): Payer: Self-pay

## 2019-03-19 ENCOUNTER — Other Ambulatory Visit: Payer: Self-pay

## 2019-03-19 DIAGNOSIS — N3090 Cystitis, unspecified without hematuria: Secondary | ICD-10-CM | POA: Diagnosis present

## 2019-03-19 LAB — POCT URINALYSIS DIP (DEVICE)
Bilirubin Urine: NEGATIVE
Glucose, UA: NEGATIVE mg/dL
Ketones, ur: NEGATIVE mg/dL
Leukocytes,Ua: NEGATIVE
Nitrite: NEGATIVE
Protein, ur: 100 mg/dL — AB
Specific Gravity, Urine: >=1.03 (ref 1.005–1.030)
Urobilinogen, UA: 0.2 mg/dL (ref 0.0–1.0)
pH: 5.5 (ref 5.0–8.0)

## 2019-03-19 MED ORDER — PHENAZOPYRIDINE HCL 200 MG PO TABS: 200.0000 mg | ORAL_TABLET | Freq: Three times a day (TID) | ORAL | 0 refills | Status: DC

## 2019-03-19 MED ORDER — NITROFURANTOIN MONOHYD MACRO 100 MG PO CAPS: 100.0000 mg | ORAL_CAPSULE | Freq: Two times a day (BID) | ORAL | 0 refills | Status: DC

## 2019-03-19 NOTE — ED Triage Notes (Signed)
Pt states she thinks she has a UTI this started today. Pt states she has painful pressure when voiding.

## 2019-03-19 NOTE — ED Provider Notes (Signed)
[place]    CSN: [id number] Arrival date & time: 03/19/19  1318      History   Chief Complaint Chief Complaint  Patient presents with  . Urinary Tract Infection    HPI Wanda Nunez is a 34 y.o. female.   HPI  Since yesterday patient has had urinary frequency, small amounts of urine, burning at the end of urination, pressure sensation with voiding.  No vaginal discharge or pain.  Normal regular menstrual periods is not pregnant.  Has never had a bladder infection before.  She had traces of blood in the urine.  No fever or chills.  No nausea vomiting.  No flank pain or back pain.  Past Medical History:  Diagnosis Date  . 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 CESAREAN SECTION      OB History    Gravida  1   Para      Term      Preterm      AB      Living        SAB      TAB      Ectopic      Multiple      Live Births               Home Medications    Prior to Admission medications   Medication Sig Start Date End Date Taking? Authorizing Provider  nitrofurantoin, macrocrystal-monohydrate, (MACROBID) 100 MG capsule Take 1 capsule (100 mg total) by mouth 2 (two) times daily. 03/19/19   Raylene Everts, MD  pantoprazole (PROTONIX) 40 MG tablet Take 1 tablet (40 mg total) by mouth 2 (two) times daily. To reduce stomach acid 10/28/17   Fulp, Cammie, MD  phenazopyridine (PYRIDIUM) 200 MG tablet Take 1 tablet (200 mg total) by mouth 3 (three) times daily. 03/19/19   Raylene Everts, MD  traZODone (DESYREL) 50 MG tablet Take 0.5-1 tablets (25-50 mg total) by mouth at bedtime as needed for sleep. 05/28/18   Fulp, Cammie, MD  Vitamin D, Ergocalciferol, (DRISDOL) 1.25 MG (50000 UT) CAPS capsule Take 1 capsule (50,000 Units total) by mouth every 7 (seven) days. 05/30/18   Antony Blackbird, MD    Family History Family History    Problem Relation Age of Onset  . Diabetes Mother   . Hypertension Mother   . Healthy Father   . Hypertension Maternal Grandmother   . Peripheral vascular disease Maternal Grandmother   . Stroke Maternal Grandfather   . Hypertension Brother   . Vasculitis Brother   . CAD Neg Hx   . CVA Neg Hx   . Cancer Neg Hx     Social History Social History   Tobacco Use  . Smoking status: Never Smoker  . Smokeless tobacco: Never Used  Substance Use Topics  . Alcohol use: No  . Drug use: No     Allergies   Patient has no known allergies.   Review of Systems Review of Systems  Constitutional: Negative for fever.  Gastrointestinal: Negative for nausea and vomiting.  Genitourinary: Positive for difficulty urinating, dysuria, frequency and hematuria. Negative for flank pain, menstrual problem, pelvic pain and vaginal discharge.     Physical Exam Triage Vital Signs ED Triage Vitals  Enc Vitals Group     BP 03/19/19 1342 (!) 134/92     Pulse Rate  03/19/19 1342 84     Resp 03/19/19 1342 16     Temp 03/19/19 1342 98.5 F (36.9 C)     Temp Source 03/19/19 1342 Oral     SpO2 03/19/19 1342 100 %     Weight --      Height --      Head Circumference --      Peak Flow --      Pain Score 03/19/19 1401 0     Pain Loc --      Pain Edu? --      Excl. in [place]? --    No data found.  Updated Vital Signs BP (!) 134/92 (BP Location: Right Arm)   Pulse 84   Temp 98.5 F (36.9 C) (Oral)   Resp 16   LMP 03/02/2019   SpO2 100%      Physical Exam Constitutional:      General: She is not in acute distress.    Appearance: She is well-developed.  HENT:     Head: Normocephalic and atraumatic.     Mouth/Throat:     Comments: Mask in place Eyes:     Conjunctiva/sclera: Conjunctivae normal.     Pupils: Pupils are equal, round, and reactive to light.  Cardiovascular:     Rate and Rhythm: Normal rate.  Pulmonary:     Effort: Pulmonary effort is normal. No respiratory distress.   Abdominal:     Tenderness: There is no right CVA tenderness or left CVA tenderness.  Musculoskeletal:        General: Normal range of motion.     Cervical back: Normal range of motion.  Skin:    General: Skin is warm and dry.  Neurological:     Mental Status: She is alert.  Psychiatric:        Mood and Affect: Mood normal.        Behavior: Behavior normal.      UC Treatments / Results  Labs (all labs ordered are listed, but only abnormal results are displayed) Labs Reviewed  POCT URINALYSIS DIP (DEVICE) - Abnormal; Notable for the following components:      Result Value   Hgb urine dipstick MODERATE (*)    Protein, ur 100 (*)    All other components within normal limits  URINE CULTURE    EKG    Procedures Procedures (including critical care time)  Medications Ordered in UC Medications - No data to display  Initial Impression / Assessment and Plan / UC Course  I have reviewed the triage vital signs and the nursing notes.  Pertinent labs & imaging results that were available during my care of the patient were reviewed by me and considered in my medical decision making (see chart for details).     Reviewed signs and symptoms of cystitis.  Treatment.  Expectations of recovery Final Clinical Impressions(s) / UC Diagnoses   Final diagnoses:  Cystitis     Discharge Instructions     Take the Macrobid ( nitrofurantoin) 2 x a day for 5 days This is the antibiotic Take the Pyridium ( phenazopyridine ) 3 x a day as needed for pain This will make the urine orange Call or return for problems    ED Prescriptions    Medication Sig Dispense Auth. Provider   phenazopyridine (PYRIDIUM) 200 MG tablet Take 1 tablet (200 mg total) by mouth 3 (three) times daily. 6 tablet Raylene Everts, MD   nitrofurantoin, macrocrystal-monohydrate, (MACROBID) 100 MG capsule Take 1  capsule (100 mg total) by mouth 2 (two) times daily. 10 capsule Raylene Everts, MD     PDMP not  reviewed this encounter.   Raylene Everts, MD 03/19/19 [phone]

## 2019-03-19 NOTE — Discharge Instructions (Signed)
Take the Macrobid ( nitrofurantoin) 2 x a day for 5 days This is the antibiotic Take the Pyridium ( phenazopyridine ) 3 x a day as needed for pain This will make the urine orange Call or return for problems

## 2019-03-21 LAB — URINE CULTURE
Culture: >=100000 — AB
Special Requests: NORMAL

## 2019-03-31 ENCOUNTER — Other Ambulatory Visit: Payer: Self-pay | Admitting: Gastroenterology

## 2019-03-31 DIAGNOSIS — R109 Unspecified abdominal pain: Secondary | ICD-10-CM

## 2019-04-14 ENCOUNTER — Other Ambulatory Visit: Payer: Medicaid Other

## 2019-05-12 ENCOUNTER — Emergency Department (HOSPITAL_COMMUNITY)
Admission: EM | Admit: 2019-05-12 | Discharge: 2019-05-12 | Disposition: A | Discharge status: Home / Self Care | Payer: Medicaid Other | Attending: Emergency Medicine | Admitting: Emergency Medicine

## 2019-05-12 ENCOUNTER — Encounter (HOSPITAL_COMMUNITY): Payer: Self-pay | Admitting: Emergency Medicine

## 2019-05-12 ENCOUNTER — Other Ambulatory Visit: Payer: Self-pay

## 2019-05-12 ENCOUNTER — Emergency Department (HOSPITAL_COMMUNITY): Payer: Medicaid Other

## 2019-05-12 DIAGNOSIS — Z5321 Procedure and treatment not carried out due to patient leaving prior to being seen by health care provider: Secondary | ICD-10-CM | POA: Diagnosis not present

## 2019-05-12 DIAGNOSIS — M25512 Pain in left shoulder: Secondary | ICD-10-CM | POA: Diagnosis present

## 2019-05-12 NOTE — ED Notes (Signed)
Pt came to this tech notifying that she was leaving. Pt was encouraged to stay. Pt left
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
## 2019-06-18 ENCOUNTER — Ambulatory Visit
Admission: RE | Admit: 2019-06-18 | Discharge: 2019-06-18 | Disposition: A | Discharge status: Home / Self Care | Payer: Medicaid Other | Source: Ambulatory Visit | Attending: Gastroenterology | Admitting: Gastroenterology

## 2019-06-18 DIAGNOSIS — R109 Unspecified abdominal pain: Secondary | ICD-10-CM

## 2019-06-18 MED ORDER — IOPAMIDOL (ISOVUE-300) INJECTION 61%
100.0000 mL | Freq: Once | INTRAVENOUS | Status: AC | PRN
  Administered 2019-06-18: 100 mL via INTRAVENOUS

## 2019-07-28 ENCOUNTER — Other Ambulatory Visit: Payer: Self-pay

## 2019-07-28 ENCOUNTER — Ambulatory Visit: Payer: Medicaid Other | Attending: Family Medicine | Admitting: Family Medicine

## 2019-09-22 ENCOUNTER — Other Ambulatory Visit: Payer: Self-pay | Admitting: Family Medicine

## 2019-09-22 DIAGNOSIS — E559 Vitamin D deficiency, unspecified: Secondary | ICD-10-CM

## 2019-09-22 MED ORDER — VITAMIN D (ERGOCALCIFEROL) 1.25 MG (50000 UNIT) PO CAPS: 50000.0000 [IU] | ORAL_CAPSULE | ORAL | 0 refills | Status: DC

## 2019-10-02 ENCOUNTER — Ambulatory Visit: Admission: RE | Admit: 2019-10-02 | Payer: Medicaid Other | Source: Ambulatory Visit

## 2019-10-02 ENCOUNTER — Other Ambulatory Visit: Payer: Self-pay | Admitting: Gastroenterology

## 2019-10-02 DIAGNOSIS — R1084 Generalized abdominal pain: Secondary | ICD-10-CM

## 2019-10-06 ENCOUNTER — Telehealth: Payer: Self-pay | Admitting: Family Medicine

## 2019-10-06 NOTE — Telephone Encounter (Signed)
Returned patient call and LVM to return call and schedule an appt.   Copied from [place] [id number]. Topic: General - Other >> Oct 02, 2019  9:31 AM Leward Quan A wrote: Reason for CRM: Patient called to schedule an appointment with Dr Chapman Fitch but c/o dark colored vaginal discharge, per patient she have a cyst on her left ovary and is concerned because she is about [redacted] weeks pregnant and would like to know if possible that she can be seen. First available appointment is 10/23/19. Please call patient with some suggestions Ph# [phone]

## 2020-08-30 ENCOUNTER — Other Ambulatory Visit: Payer: Self-pay

## 2020-08-30 ENCOUNTER — Ambulatory Visit (HOSPITAL_COMMUNITY)
Admission: EM | Admit: 2020-08-30 | Discharge: 2020-08-30 | Disposition: A | Discharge status: Home / Self Care | Payer: Medicaid Other | Attending: Physician Assistant | Admitting: Physician Assistant

## 2020-08-30 ENCOUNTER — Encounter (HOSPITAL_COMMUNITY): Payer: Self-pay

## 2020-08-30 DIAGNOSIS — B351 Tinea unguium: Secondary | ICD-10-CM

## 2020-08-30 DIAGNOSIS — K219 Gastro-esophageal reflux disease without esophagitis: Secondary | ICD-10-CM | POA: Diagnosis not present

## 2020-08-30 MED ORDER — CICLOPIROX 8 % EX SOLN: Freq: Every day | CUTANEOUS | 1 refills | Status: DC

## 2020-08-30 MED ORDER — PANTOPRAZOLE SODIUM 40 MG PO TBEC: 40.0000 mg | DELAYED_RELEASE_TABLET | Freq: Every day | ORAL | 0 refills | Status: DC

## 2020-08-30 NOTE — ED Provider Notes (Signed)
[place]    CSN: [id number] Arrival date & time: 08/30/20  1231      History   Chief Complaint Chief Complaint  Patient presents with   Toe Infection    HPI Wanda Nunez is a 35 y.o. female.   Patient presents today with several concerns.  Her primary concern today is a thickening and abnormal growth of her right great toe after injury.  Reports that she broke the toenail while she was in a pool and since it has been growing back has been thick and scaly.  She denies any significant pain, swelling, redness, bleeding, drainage.  She has tried topical medications over-the-counter without improvement of symptoms; is unsure the name of these medicines.  Patient had been treated for onychomycosis in the past.  She has not seen a podiatrist.  In addition, patient reports history of GERD that is worsened recently and she has been without her PPI.  She is previously seen a gastroenterologist but the provider she was seeing no longer takes her insurance and she has not yet established with a new specialist.  She reports since stopping this medication she has had worsening symptoms including upper abdominal pain and recurrent nausea.  She does report symptoms are more severe with certain foods.  She denies any actual vomiting, current abdominal pain, melena, hematochezia, unintentional weight loss, dysphagia.   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 Authorizing Provider  ciclopirox (PENLAC) 8 % solution Apply topically at  bedtime. Apply over nail and surrounding skin. Apply daily over previous coat. After seven (7) days, may remove with alcohol and continue cycle. 08/30/20  Yes Kynnedy Carreno K, PA-C  pantoprazole (PROTONIX) 40 MG tablet Take 1 tablet (40 mg total) by mouth daily. To reduce stomach acid 08/30/20   Elwanda Moger K, PA-C  traZODone (DESYREL) 50 MG tablet Take 0.5-1 tablets (25-50 mg total) by mouth at bedtime as needed for sleep. 05/28/18   Fulp, Cammie, MD  Vitamin D, Ergocalciferol, (DRISDOL) 1.25 MG (50000 UNIT) CAPS capsule Take 1 capsule (50,000 Units total) by mouth every 7 (seven) days. 09/22/19   Ladell Pier, MD    Family History Family History  Problem Relation Age of Onset   Diabetes Mother    Hypertension Mother    Healthy Father    Hypertension Maternal Grandmother    Peripheral vascular disease Maternal Grandmother    Stroke Maternal Grandfather    Hypertension Brother    Vasculitis Brother    CAD Neg Hx    CVA Neg Hx    Cancer Neg Hx     Social History Social History   Tobacco Use   Smoking status: Never  Smokeless tobacco: Never  Vaping Use   Vaping Use: Never used  Substance Use Topics   Alcohol use: No   Drug use: No     Allergies   Patient has no known allergies.   Review of Systems Review of Systems  Constitutional:  Negative for activity change, appetite change, fatigue and fever.  Respiratory:  Negative for cough and shortness of breath.   Cardiovascular:  Negative for chest pain.  Gastrointestinal:  Positive for abdominal pain and nausea. Negative for blood in stool, diarrhea and vomiting.  Musculoskeletal:  Negative for arthralgias and myalgias.  Neurological:  Negative for dizziness, light-headedness and headaches.    Physical Exam Triage Vital Signs ED Triage Vitals [08/30/20 1305]  Enc Vitals Group     BP 125/84     Pulse Rate 90     Resp 18     Temp 98.7 F (37.1 C)     Temp Source Oral     SpO2 99 %     Weight      Height      Head  Circumference      Peak Flow      Pain Score 5     Pain Loc      Pain Edu?      Excl. in [place]?    No data found.  Updated Vital Signs BP 125/84 (BP Location: Left Arm)   Pulse 90   Temp 98.7 F (37.1 C) (Oral)   Resp 18   LMP 08/27/2020   SpO2 99%   Visual Acuity Right Eye Distance:   Left Eye Distance:   Bilateral Distance:    Right Eye Near:   Left Eye Near:    Bilateral Near:     Physical Exam Vitals reviewed.  Constitutional:      General: She is awake. She is not in acute distress.    Appearance: Normal appearance. She is normal weight. She is not ill-appearing.     Comments: Very pleasant female appears stated age in no acute distress sitting comfortably in exam room  HENT:     Head: Normocephalic and atraumatic.  Cardiovascular:     Rate and Rhythm: Normal rate and regular rhythm.     Heart sounds: Normal heart sounds, S1 normal and S2 normal. No murmur heard. Pulmonary:     Effort: Pulmonary effort is normal.     Breath sounds: Normal breath sounds. No wheezing, rhonchi or rales.     Comments: Clear to auscultation bilaterally Abdominal:     General: Bowel sounds are normal.     Palpations: Abdomen is soft.     Tenderness: There is abdominal tenderness in the epigastric area and left upper quadrant. There is no right CVA tenderness, left CVA tenderness, guarding or rebound.     Comments: Mild to palpation throughout upper abdomen.  No evidence of acute abdomen on physical exam.  Feet:     Right foot:     Protective Sensation: 10 sites tested.  10 sites sensed.     Skin integrity: No ulcer, blister or skin breakdown.     Toenail Condition: Right toenails are abnormally thick. Fungal disease present. Psychiatric:        Behavior: Behavior is cooperative.     UC Treatments / Results  Labs (all labs ordered are listed, but only abnormal results are displayed) Labs Reviewed - No data to display  EKG   Radiology No results  found.  Procedures Procedures (including critical care time)  Medications Ordered in UC Medications - No data to display  Initial Impression / Assessment and Plan / UC Course  I have reviewed the triage vital signs and the nursing notes.  Pertinent labs & imaging results that were available during my care of the patient were reviewed by me and considered in my medical decision making (see chart for details).     No alarm symptoms of GERD that would warrant emergent evaluation.  Will restart PPI as patient was previously done well on this medication in the past.  New prescription for pantoprazole sent to pharmacy and patient was instructed to take this on an empty stomach.  Recommend she continue with dietary and lifestyle modification for additional symptom relief.  Discussed that if symptoms persist they would be worthwhile for her to establish with a new gastroenterologist.  Discussed alarm symptoms that warrant emergent evaluation.  Strict return precautions given to which patient expressed understanding.  No evidence of bacterial infection on clinical exam.  Symptoms consistent with onychomycosis.  We will start Penlac.  Discussed that this will take a regular application for several months in order to be effective.  Discussed that if symptoms or not improving she can consider oral treatment such as terbinafine but this usually requires close monitoring of liver function and so we will be prescribed by her PCP or podiatrist.  She was given contact information for podiatry should symptoms persist.  Discussed alarm symptoms that warrant emergent evaluation.  Strict return precautions given to which patient expressed understanding.   Final Clinical Impressions(s) / UC Diagnoses   Final diagnoses:  Gastroesophageal reflux disease, unspecified whether esophagitis present  Onychomycosis of great toe     Discharge Instructions      I have called in a new prescription of your pantoprazole.   Please take this on an empty stomach (30 minutes before food or 2 hours after).  It is important that you elevate the head of your bed and avoid spicy/acidic foods.  If you have any worsening symptoms including persistent symptoms, nausea/vomiting interfering with oral intake, blood in your stool, dark stools, weight loss you need to be seen immediately.  If you have any sudden severe abdominal pain, dark stools, bloody stools you need to go to the ER.  I believe that you have a fungal infection of your toenail.  Please apply medication at night.  Once a week remove this with alcohol and continue this cycle.  It can take several months before you notice a significant difference.  If you continue to have symptoms or if anything worsens please follow-up with podiatry as we discussed.     ED Prescriptions     Medication Sig Dispense Auth. Provider   ciclopirox (PENLAC) 8 % solution Apply topically at bedtime. Apply over nail and surrounding skin. Apply daily over previous coat. After seven (7) days, may remove with alcohol and continue cycle. 6.6 mL Kima Malenfant K, PA-C   pantoprazole (PROTONIX) 40 MG tablet  (Status: Discontinued) Take 1 tablet (40 mg total) by mouth daily. To reduce stomach acid 30 tablet Myracle Febres K, PA-C   pantoprazole (PROTONIX) 40 MG tablet Take 1 tablet (40 mg total) by mouth daily. To reduce stomach acid 30 tablet Lamisha Roussell K, PA-C      PDMP not reviewed this encounter.   Terrilee Croak, PA-C 08/30/20 1339

## 2020-08-30 NOTE — ED Triage Notes (Signed)
Pt presents with infection on right foot from an injury a few weeks ago.

## 2020-08-30 NOTE — Discharge Instructions (Signed)
I have called in a new prescription of your pantoprazole.  Please take this on an empty stomach (30 minutes before food or 2 hours after).  It is important that you elevate the head of your bed and avoid spicy/acidic foods.  If you have any worsening symptoms including persistent symptoms, nausea/vomiting interfering with oral intake, blood in your stool, dark stools, weight loss you need to be seen immediately.  If you have any sudden severe abdominal pain, dark stools, bloody stools you need to go to the ER.  I believe that you have a fungal infection of your toenail.  Please apply medication at night.  Once a week remove this with alcohol and continue this cycle.  It can take several months before you notice a significant difference.  If you continue to have symptoms or if anything worsens please follow-up with podiatry as we discussed.

## 2021-03-24 ENCOUNTER — Telehealth: Payer: Medicaid Other | Admitting: Physician Assistant

## 2021-03-24 ENCOUNTER — Other Ambulatory Visit: Payer: Self-pay

## 2021-03-24 ENCOUNTER — Encounter: Payer: Self-pay | Admitting: Physician Assistant

## 2021-03-24 ENCOUNTER — Ambulatory Visit (INDEPENDENT_AMBULATORY_CARE_PROVIDER_SITE_OTHER): Payer: Medicaid Other | Admitting: Podiatry

## 2021-03-24 DIAGNOSIS — B351 Tinea unguium: Secondary | ICD-10-CM

## 2021-03-24 DIAGNOSIS — L603 Nail dystrophy: Secondary | ICD-10-CM | POA: Diagnosis not present

## 2021-03-24 DIAGNOSIS — T148XXA Other injury of unspecified body region, initial encounter: Secondary | ICD-10-CM

## 2021-03-24 DIAGNOSIS — Z79899 Other long term (current) drug therapy: Secondary | ICD-10-CM | POA: Diagnosis not present

## 2021-03-24 NOTE — Patient Instructions (Signed)
Instructions sent via Patient Message.  ?

## 2021-03-24 NOTE — Progress Notes (Signed)
?Virtual Visit Consent  ? ?Wanda Nunez, you are scheduled for a virtual visit with a [facility] provider today.   ?  ?Just as with appointments in the office, your consent must be obtained to participate.  Your consent will be active for this visit and any virtual visit you may have with one of our providers in the next 365 days.   ?  ?If you have a MyChart account, a copy of this consent can be sent to you electronically.  All virtual visits are billed to your insurance company just like a traditional visit in the office.   ? ?As this is a virtual visit, video technology does not allow for your provider to perform a traditional examination.  This may limit your provider's ability to fully assess your condition.  If your provider identifies any concerns that need to be evaluated in person or the need to arrange testing (such as labs, EKG, etc.), we will make arrangements to do so.   ?  ?Although advances in technology are sophisticated, we cannot ensure that it will always work on either your end or our end.  If the connection with a video visit is poor, the visit may have to be switched to a telephone visit.  With either a video or telephone visit, we are not always able to ensure that we have a secure connection.    ? ?I need to obtain your verbal consent now.   Are you willing to proceed with your visit today?  ?  ?[name] has provided verbal consent on 03/24/2021 for a virtual visit (video or telephone). ?  ?Leeanne Rio, PA-C  ? ?Date: 03/24/2021 3:37 PM ? ? ?Virtual Visit via Video Note  ? ?ILeeanne Rio, connected with  Wanda Nunez  ([id number], January 17, 1986) on 03/24/21 at  3:30 PM EST by a video-enabled telemedicine application and verified that I am speaking with the correct person using two identifiers. ? ?Location: ?Patient: Virtual Visit Location Patient: Home ?Provider: Virtual Visit Location Provider: Home Office ?  ?I discussed the limitations of evaluation and management by  telemedicine and the availability of in person appointments. The patient expressed understanding and agreed to proceed.   ? ?History of Present Illness: ?Wanda Nunez is a 36 y.o. who identifies as a female who was assigned female at birth, and is being seen today for bleeding of her L great toe after nail removal this morning with Podiatry. She notes has been bleeding since she got home and first took a look at the bandage. She has tried soaks with epsom salt and pressure to stop bleeding but this has continued. Thankfully is a small area of bleeding but cannot get to stop. Tried to call Podiatry office but they closed at lunch and she could not get to a nurse.  ? ?HPI: HPI  ?Problems:  ?Patient Active Problem List  ? Diagnosis Date Noted  ? 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  ?Allergies: No Known Allergies ?Medications:  ?Current Outpatient Medications:  ?  ciclopirox (PENLAC) 8 % solution, Apply topically at bedtime. Apply over nail and surrounding skin. Apply daily over previous coat. After seven (7) days, may remove with alcohol and continue cycle., Disp: 6.6 mL, Rfl: 1 ?  pantoprazole (PROTONIX) 40 MG tablet, Take 1 tablet (40 mg total) by mouth daily. To reduce stomach acid, Disp: 30 tablet, Rfl: 0 ?  traZODone (DESYREL) 50 MG tablet, Take 0.5-1 tablets (25-50 mg total) by mouth at bedtime as needed for  sleep., Disp: 30 tablet, Rfl: 3 ?  Vitamin D, Ergocalciferol, (DRISDOL) 1.25 MG (50000 UNIT) CAPS capsule, Take 1 capsule (50,000 Units total) by mouth every 7 (seven) days., Disp: 4 capsule, Rfl: 0 ? ?Observations/Objective: ?Patient is well-developed, well-nourished in no acute distress.  ?Resting comfortably at home.  ?Head is normocephalic, atraumatic.  ?No labored breathing. ?Speech is clear and coherent with logical content.  ?Patient is alert and oriented at baseline.  ?Great toe assessed with distal bleeding noted. Hard to completely assess the point of bleed due to how quickly it fills the nailbed up and due to  quality and handling of video camera.  ? ?Assessment and Plan: ?1. Bleeding from wound ? ?After nail removal earlier today. Continual since then. She is unable to stop with pressure. She will require an in-person evaluation. Did call Podiatry office and get the doc-on-call number from recording. Gave to patient to she can reach out to them to make sure they do not have other instructions for her before seeking in-person evaluation. Told her if unable to get through -- proceed to nearest urgent care to get this taken care of.  ? ?Follow Up Instructions: ?I discussed the assessment and treatment plan with the patient. The patient was provided an opportunity to ask questions and all were answered. The patient agreed with the plan and demonstrated an understanding of the instructions.  A copy of instructions were sent to the patient via MyChart unless otherwise noted below.  ? ?The patient was advised to call back or seek an in-person evaluation if the symptoms worsen or if the condition fails to improve as anticipated. ? ?Time:  ?I spent 10 minutes with the patient via telehealth technology discussing the above problems/concerns.   ? ?Leeanne Rio, PA-C ?

## 2021-03-29 NOTE — Progress Notes (Signed)
?Subjective:  ?Patient ID: Wanda Nunez, female    DOB: [birth year],  MRN: [id number] ? ?Chief Complaint  ?Patient presents with  ? Nail Problem  ?  Right hallux nail   ? ? ?36 y.o. female presents with the above complaint.  Patient presents with complaint of right hallux onychodystrophy/nail dystrophy.  She states that causing her pain is thick discolored.  She would like to have it removed.  She would also like to discuss treatment for fungus.  She has tried some over-the-counter stuff none of which has helped.  She is left and would like to discuss oral medication.  She has not seen anyone else prior to seeing me.  She states that she is not a diabetic. ? ? ?Review of Systems: Negative except as noted in the HPI. Denies N/V/F/Ch. ? ?Past Medical History:  ?Diagnosis Date  ? 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 ? ?Current Outpatient Medications:  ?  ciclopirox (PENLAC) 8 % solution, Apply topically at bedtime. Apply over nail and surrounding skin. Apply daily over previous coat. After seven (7) days, may remove with alcohol and continue cycle., Disp: 6.6 mL, Rfl: 1 ?  pantoprazole (PROTONIX) 40 MG tablet, Take 1 tablet (40 mg total) by mouth daily. To reduce stomach acid, Disp: 30 tablet, Rfl: 0 ?  traZODone (DESYREL) 50 MG tablet, Take 0.5-1 tablets (25-50 mg total) by mouth at bedtime as needed for sleep., Disp: 30 tablet, Rfl: 3 ?  Vitamin D, Ergocalciferol, (DRISDOL) 1.25 MG (50000 UNIT) CAPS capsule, Take 1 capsule (50,000 Units total) by mouth every 7 (seven) days., Disp: 4 capsule, Rfl: 0 ? ?Social History  ? ?Tobacco Use  ?Smoking Status Never  ?Smokeless Tobacco Never  ? ? ?No Known Allergies ?Objective:  ?There were no vitals filed for this visit. ?There is no height or weight on file to calculate BMI. ?Constitutional Well developed. ?Well nourished.  ?Vascular Dorsalis pedis pulses palpable bilaterally. ?Posterior tibial pulses palpable bilaterally. ?Capillary refill normal to all digits.  ?No cyanosis or clubbing  noted. ?Pedal hair growth normal.  ?Neurologic Normal speech. ?Oriented to person, place, and time. ?Epicritic sensation to light touch grossly present bilaterally.  ?Dermatologic Nails thickened elongated dystrophic mycotic toenails x1 right hallux.  Mild pain on palpation ?Skin within normal limits  ?Orthopedic: Normal joint ROM without pain or crepitus bilaterally. ?No visible deformities. ?No bony tenderness.  ? ?Radiographs: None ?Assessment:  ? ?1. Long-term use of high-risk medication   ?2. Onychomycosis due to dermatophyte   ?3. Nail dystrophy   ? ?Plan:  ?Patient was evaluated and treated and all questions answered. ? ?Right hallux onychomycosis ?-Educated the patient on the etiology of onychomycosis and various treatment options associated with improving the fungal load.  I explained to the patient that there is 3 treatment options available to treat the onychomycosis including topical, p.o., laser treatment.  Patient elected to undergo p.o. options with Lamisil/terbinafine therapy.  In order for me to start the medication therapy, I explained to the patient the importance of evaluating the liver and obtaining the liver function test.  Once the liver function test comes back normal I will start him on 27-monthcourse of Lamisil therapy.  Patient understood all risk and would like to proceed with Lamisil therapy.  I have asked the patient to immediately stop the Lamisil therapy if she has any reactions to it and call the office or go to the emergency room right away.  Patient states understanding ? ? ?Nail contusion/dystrophy hallux, right ?-Patient elects  to proceed with minor surgery to remove entire toenail today. Consent reviewed and signed by patient. ?-Entire/total nail excised. See procedure note. ?-Educated on post-procedure care including soaking. Written instructions provided and reviewed. ?-Patient to follow up in 2 weeks for nail check. ? ?Procedure: Excision of entire/total nail  ?Location:  Right 1st toe digit ?Anesthesia: Lidocaine 1% plain; 1.5 mL and Marcaine 0.5% plain; 1.5 mL, digital block. ?Skin Prep: Betadine. ?Dressing: Silvadene; telfa; dry, sterile, compression dressing. ?Technique: Following skin prep, the toe was exsanguinated and a tourniquet was secured at the base of the toe. The affected nail border was freed and excised. The tourniquet was then removed and sterile dressing applied. ?Disposition: Patient tolerated procedure well. Patient to return in 2 weeks for follow-up.  ? ?No follow-ups on file. ? ? ?No follow-ups on file. ?

## 2021-06-14 ENCOUNTER — Emergency Department (HOSPITAL_COMMUNITY)
Admission: EM | Admit: 2021-06-14 | Discharge: 2021-06-14 | Disposition: A | Discharge status: Home / Self Care | Payer: Medicaid Other | Attending: Emergency Medicine | Admitting: Emergency Medicine

## 2021-06-14 ENCOUNTER — Encounter (HOSPITAL_COMMUNITY): Payer: Self-pay

## 2021-06-14 ENCOUNTER — Emergency Department (HOSPITAL_COMMUNITY): Payer: Medicaid Other

## 2021-06-14 ENCOUNTER — Other Ambulatory Visit: Payer: Self-pay

## 2021-06-14 DIAGNOSIS — N814 Uterovaginal prolapse, unspecified: Secondary | ICD-10-CM | POA: Insufficient documentation

## 2021-06-14 DIAGNOSIS — D649 Anemia, unspecified: Secondary | ICD-10-CM | POA: Insufficient documentation

## 2021-06-14 DIAGNOSIS — N3001 Acute cystitis with hematuria: Secondary | ICD-10-CM | POA: Diagnosis not present

## 2021-06-14 LAB — CBC WITH DIFFERENTIAL/PLATELET
Abs Immature Granulocytes: 0 10*3/uL (ref 0.00–0.07)
Basophils Absolute: 0.2 10*3/uL — ABNORMAL HIGH (ref 0.0–0.1)
Basophils Relative: 2 %
Eosinophils Absolute: 0.5 10*3/uL (ref 0.0–0.5)
Eosinophils Relative: 6 %
HCT: 30.8 % — ABNORMAL LOW (ref 36.0–46.0)
Hemoglobin: 8.7 g/dL — ABNORMAL LOW (ref 12.0–15.0)
Lymphocytes Relative: 22 %
Lymphs Abs: 2 10*3/uL (ref 0.7–4.0)
MCH: 19.2 pg — ABNORMAL LOW (ref 26.0–34.0)
MCHC: 28.2 g/dL — ABNORMAL LOW (ref 30.0–36.0)
MCV: 68 fL — ABNORMAL LOW (ref 80.0–100.0)
Monocytes Absolute: 0.4 10*3/uL (ref 0.1–1.0)
Monocytes Relative: 4 %
Neutro Abs: 6 10*3/uL (ref 1.7–7.7)
Neutrophils Relative %: 66 %
Platelets: 287 10*3/uL (ref 150–400)
RBC: 4.53 MIL/uL (ref 3.87–5.11)
RDW: 16.1 % — ABNORMAL HIGH (ref 11.5–15.5)
WBC: 9.1 10*3/uL (ref 4.0–10.5)
nRBC: 0 % (ref 0.0–0.2)
nRBC: 0 /100 WBC

## 2021-06-14 LAB — COMPREHENSIVE METABOLIC PANEL
ALT: 10 U/L (ref 0–44)
AST: 16 U/L (ref 15–41)
Albumin: 3.7 g/dL (ref 3.5–5.0)
Alkaline Phosphatase: 41 U/L (ref 38–126)
Anion gap: 5 (ref 5–15)
BUN: 5 mg/dL — ABNORMAL LOW (ref 6–20)
CO2: 22 mmol/L (ref 22–32)
Calcium: 8.6 mg/dL — ABNORMAL LOW (ref 8.9–10.3)
Chloride: 109 mmol/L (ref 98–111)
Creatinine, Ser: 0.77 mg/dL (ref 0.44–1.00)
GFR, Estimated: >60 mL/min (ref >60)
Glucose, Bld: 112 mg/dL — ABNORMAL HIGH (ref 70–99)
Potassium: 3.6 mmol/L (ref 3.5–5.1)
Sodium: 136 mmol/L (ref 135–145)
Total Bilirubin: 0.3 mg/dL (ref 0.3–1.2)
Total Protein: 7.2 g/dL (ref 6.5–8.1)

## 2021-06-14 LAB — URINALYSIS, ROUTINE W REFLEX MICROSCOPIC
Bilirubin Urine: NEGATIVE
Glucose, UA: NEGATIVE mg/dL
Ketones, ur: NEGATIVE mg/dL
Nitrite: NEGATIVE
Protein, ur: 100 mg/dL — AB
Specific Gravity, Urine: 1.018 (ref 1.005–1.030)
pH: 5 (ref 5.0–8.0)

## 2021-06-14 LAB — WET PREP, GENITAL
Clue Cells Wet Prep HPF POC: NONE SEEN
Sperm: NONE SEEN
Trich, Wet Prep: NONE SEEN
WBC, Wet Prep HPF POC: <10 (ref <10)
Yeast Wet Prep HPF POC: NONE SEEN

## 2021-06-14 LAB — POC URINE PREG, ED: Preg Test, Ur: NEGATIVE

## 2021-06-14 MED ORDER — CEPHALEXIN 500 MG PO CAPS: 500.0000 mg | ORAL_CAPSULE | Freq: Three times a day (TID) | ORAL | 0 refills | Status: AC

## 2021-06-14 MED ORDER — ACETAMINOPHEN 325 MG PO TABS
650.0000 mg | ORAL_TABLET | Freq: Once | ORAL | Status: AC
  Administered 2021-06-14: 650 mg via ORAL
  Filled 2021-06-14: qty 2

## 2021-06-14 MED ORDER — MORPHINE SULFATE (PF) 4 MG/ML IV SOLN
4.0000 mg | Freq: Once | INTRAVENOUS | Status: AC
  Administered 2021-06-14: 4 mg via INTRAVENOUS
  Filled 2021-06-14: qty 1

## 2021-06-14 MED ORDER — IOHEXOL 300 MG/ML  SOLN
100.0000 mL | Freq: Once | INTRAMUSCULAR | Status: AC | PRN
  Administered 2021-06-14: 100 mL via INTRAVENOUS

## 2021-06-14 MED ORDER — OXYCODONE HCL 5 MG PO TABS: 5.0000 mg | ORAL_TABLET | ORAL | 0 refills | Status: DC | PRN

## 2021-06-14 NOTE — ED Notes (Signed)
RN reviewed discharge instructions w/ pt. Pain medication and follow up reviewed, pt had no further questions

## 2021-06-14 NOTE — ED Provider Notes (Signed)
[facility] Provider Note   CSN: [id number] Arrival date & time: 06/14/21  1418    History  Chief Complaint  Patient presents with   Vaginal Prolapse    Wanda Nunez is a 36 y.o. female with no significant past medical history here for evaluation of possible vaginal prolapse. States she is due for her menstrual cycle. Felt like she was going to have a blood clot come which is common for her menstrual cycle and when she went to bare down she noted a bulge to the vagina. Prior C/S. No hx of prolapse. Was fine prior to baring down. No fever, emesis, pain, rectal pain, bleeding, weakness. Does have some dysuria.  Medical abortion with pills 2 months ago. Bleed a lot at initial onset. Normal cycle 2 month later. No hx of fibroids. Hx of chronic heavy cycles with large clots during her period   G3P2  HPI     Home Medications Prior to Admission medications   Medication Sig Start Date End Date Taking? Authorizing Provider  cephALEXin (KEFLEX) 500 MG capsule Take 1 capsule (500 mg total) by mouth 3 (three) times daily for 7 days. 06/14/21 06/21/21 Yes Renaud Celli A, PA-C  oxyCODONE (ROXICODONE) 5 MG immediate release tablet Take 1 tablet (5 mg total) by mouth every 4 (four) hours as needed for severe pain. 06/14/21  Yes Antione Obar A, PA-C  ciclopirox (PENLAC) 8 % solution Apply topically at bedtime. Apply over nail and surrounding skin. Apply daily over previous coat. After seven (7) days, may remove with alcohol and continue cycle. 08/30/20   Raspet, Derry Skill, PA-C  pantoprazole (PROTONIX) 40 MG tablet Take 1 tablet (40 mg total) by mouth daily. To reduce stomach acid 08/30/20   Raspet, Erin K, PA-C  traZODone (DESYREL) 50 MG tablet Take 0.5-1 tablets (25-50 mg total) by mouth at bedtime as needed for sleep. 05/28/18   Fulp, Cammie, MD  Vitamin D, Ergocalciferol, (DRISDOL) 1.25 MG (50000 UNIT) CAPS capsule Take 1 capsule (50,000 Units total) by mouth  every 7 (seven) days. 09/22/19   Ladell Pier, MD      Allergies    Patient has no known allergies.    Review of Systems   Review of Systems  Constitutional: Negative.   HENT: Negative.    Respiratory: Negative.    Cardiovascular: Negative.   Gastrointestinal: Negative.   Genitourinary:        Vaginal bulge   Musculoskeletal: Negative.   Skin: Negative.   Neurological: Negative.   All other systems reviewed and are negative.  Physical Exam Updated Vital Signs BP 109/83   Pulse 81   Temp 98.9 F (37.2 C) (Oral)   Resp 18   Ht '5\' 5"'$  (1.651 m)   Wt 69.4 kg   SpO2 100%   BMI 25.46 kg/m  Physical Exam Vitals and nursing note reviewed. Exam conducted with a chaperone present.  Constitutional:      General: She is not in acute distress.    Appearance: She is well-developed. She is not ill-appearing, toxic-appearing or diaphoretic.  HENT:     Head: Atraumatic.     Nose: Nose normal.     Mouth/Throat:     Mouth: Mucous membranes are moist.  Eyes:     Pupils: Pupils are equal, round, and reactive to light.  Cardiovascular:     Rate and Rhythm: Normal rate.     Pulses: Normal pulses.     Heart sounds: Normal heart sounds.  Pulmonary:     Effort: Pulmonary effort is normal. No respiratory distress.     Breath sounds: Normal breath sounds.  Abdominal:     General: Bowel sounds are normal. There is no distension.     Palpations: Abdomen is soft.  Genitourinary:    Labia:        Right: No rash, tenderness, lesion or injury.        Left: No rash, tenderness, lesion or injury.      Comments: No urethral prolapse.  No Bladder prolapse. Appear to have alrge cervical prolapse, able to be reduced. Macerated tissue in vaginal vault. No blood. No lacerations to suture. Musculoskeletal:        General: No swelling, tenderness, deformity or signs of injury. Normal range of motion.     Cervical back: Normal range of motion.  Skin:    General: Skin is warm and dry.      Capillary Refill: Capillary refill takes less than 2 seconds.  Neurological:     General: No focal deficit present.     Mental Status: She is alert and oriented to person, place, and time.  Psychiatric:        Mood and Affect: Mood normal.   ED Results / Procedures / Treatments   Labs (all labs ordered are listed, but only abnormal results are displayed) Labs Reviewed  URINALYSIS, ROUTINE W REFLEX MICROSCOPIC - Abnormal; Notable for the following components:      Result Value   APPearance HAZY (*)    Hgb urine dipstick MODERATE (*)    Protein, ur 100 (*)    Leukocytes,Ua TRACE (*)    Bacteria, UA RARE (*)    All other components within normal limits  CBC WITH DIFFERENTIAL/PLATELET - Abnormal; Notable for the following components:   Hemoglobin 8.7 (*)    HCT 30.8 (*)    MCV 68.0 (*)    MCH 19.2 (*)    MCHC 28.2 (*)    RDW 16.1 (*)    Basophils Absolute 0.2 (*)    All other components within normal limits  COMPREHENSIVE METABOLIC PANEL - Abnormal; Notable for the following components:   Glucose, Bld 112 (*)    BUN 5 (*)    Calcium 8.6 (*)    All other components within normal limits  WET PREP, GENITAL  URINE CULTURE  POC URINE PREG, ED  GC/CHLAMYDIA PROBE AMP ([place]) NOT AT [facility]    EKG None  Radiology CT ABDOMEN PELVIS W CONTRAST  Result Date: 06/14/2021 CLINICAL DATA:  Pelvic pain EXAM: CT ABDOMEN AND PELVIS WITH CONTRAST TECHNIQUE: Multidetector CT imaging of the abdomen and pelvis was performed using the standard protocol following bolus administration of intravenous contrast. RADIATION DOSE REDUCTION: This exam was performed according to the departmental dose-optimization program which includes automated exposure control, adjustment of the mA and/or kV according to patient size and/or use of iterative reconstruction technique. CONTRAST:  155m OMNIPAQUE IOHEXOL 300 MG/ML  SOLN COMPARISON:  CT 06/18/2019 FINDINGS: Lower chest: Lung bases demonstrate no acute  consolidation or effusion. Normal cardiac size. Hepatobiliary: No focal liver abnormality is seen. No gallstones, gallbladder wall thickening, or biliary dilatation. Pancreas: Unremarkable. No pancreatic ductal dilatation or surrounding inflammatory changes. Spleen: Normal in size without focal abnormality. Adrenals/Urinary Tract: Adrenal glands are unremarkable. Kidneys are normal, without renal calculi, focal lesion, or hydronephrosis. Bladder is unremarkable. Stomach/Bowel: Stomach is within normal limits. Appendix appears normal. No evidence of bowel wall thickening, distention, or inflammatory changes. Vascular/Lymphatic: No  significant vascular findings are present. No enlarged abdominal or pelvic lymph nodes. Reproductive: Few small uterine fibroids. Large solid appearing cervical mass measuring 5.7 by 6 by 6.4 cm. No suspicious adnexal mass. Other: Negative for pelvic effusion or free air. Musculoskeletal: No acute or significant osseous findings. IMPRESSION: 1. Large solid 6.4 cm mass within the cervix. Differential considerations include prolapsed submucosal fibroid versus endometrial or cervical mass. Gynecology consultation recommended as is direct visualization. Electronically Signed   By: Donavan Foil M.D.   On: 06/14/2021 18:53    Procedures Procedures    Medications Ordered in ED Medications  morphine (PF) 4 MG/ML injection 4 mg (4 mg Intravenous Given 06/14/21 1650)  acetaminophen (TYLENOL) tablet 650 mg (650 mg Oral Given 06/14/21 1649)  iohexol (OMNIPAQUE) 300 MG/ML solution 100 mL (100 mLs Intravenous Contrast Given 06/14/21 1838)   ED Course/ Medical Decision Making/ A&P    36 year old here for evaluation of vaginal bulge. Occurred when baring down. No hx of similar. Prior C/s. Feels like she is leaking urine when she stands.  Did have a medical abortion with pills 2 months ago.  Bled heavy at that time, stopped, had normal menstrual cycle last month.  She is due for her period  currently.  Does have history of very heavy menstrual cycles with clotting which is not abnormal for her.  No lightheadedness or dizziness.  Not currently bleeding.  Plan on labs, imaging  Patient has what appears to be prolapse uterus or bladder about halfway through vaginal canal. Able to be reduced on exam.  Labs and imaging personally viewed and interpreted:  UA neg for infection Preg neg Wet prep neg CMP without significant abnormality CB without leukocytosis, Hgb 8.7, prior 3 years ago at 11, denies lightheadedness, dizziness. Hx of anemia per patient. CTAP with 6.4 cm prolapsed mass within the cervix. Rec Gyn consultation  CONSULT with Dr. Ilda Basset with Obgyn.  Discussed patient's exam, CT scan findings, labs, concerns for persistent leaking urine. Rec Treat UA, Follow up outpatient.  Discussed labs, imaging, follow-up with patient.  She is agreeable.  We will have her return for new or worsening symptoms  The patient has been appropriately medically screened and/or stabilized in the ED. I have low suspicion for any other emergent medical condition which would require further screening, evaluation or treatment in the ED or require inpatient management.  Patient is hemodynamically stable and in no acute distress.  Patient able to ambulate in department prior to ED.  Evaluation does not show acute pathology that would require ongoing or additional emergent interventions while in the emergency department or further inpatient treatment.  I have discussed the diagnosis with the patient and answered all questions.  Pain is been managed while in the emergency department and patient has no further complaints prior to discharge.  Patient is comfortable with plan discussed in room and is stable for discharge at this time.  I have discussed strict return precautions for returning to the emergency department.  Patient was encouraged to follow-up with PCP/specialist refer to at discharge.                             Medical Decision Making Amount and/or Complexity of Data Reviewed External Data Reviewed: labs, radiology and notes. Labs: ordered. Decision-making details documented in ED Course. Radiology: ordered.  Risk OTC drugs. Prescription drug management. Parenteral controlled substances. Diagnosis or treatment significantly limited by social determinants of health.  Final Clinical Impression(s) / ED Diagnoses Final diagnoses:  Uterine prolapse  Anemia, unspecified type  Acute cystitis with hematuria    Rx / DC Orders ED Discharge Orders          Ordered    cephALEXin (KEFLEX) 500 MG capsule  3 times daily        06/14/21 2005    oxyCODONE (ROXICODONE) 5 MG immediate release tablet  Every 4 hours PRN        06/14/21 2005              Tuwanda Vokes A, PA-C 06/14/21 2013    Lacretia Leigh, MD 06/15/21 1614

## 2021-06-14 NOTE — Discharge Instructions (Addendum)
I have discussed your CT scan of your uterine prolapse with OB/GYN.  They have placed a referral you should hear from their office.  I have placed a number in your discharge paperwork.  Call to schedule appointment if you do not hear from them  I have given her a short course of pain medicine.  Do not drive or operate heavy machinery while taking this medication.  This medication is an opiate which means it does have the addictive potential.  Urine did show bacteria we have started on antibiotic for this.  Take as prescribed.  Return for new or worsening symptoms.

## 2021-06-14 NOTE — ED Triage Notes (Signed)
Pt arrived POV from c/o a vaginal issue. Pt states she is cramping like she is about to start her period and was feeling some pressure. Pt states she usually has clots when she is on her period so she went to the bathroom and started bearing down and stated something came out and is still hanging out but it is not a blood clot. Pt states it is uncomfortable and burns down there.

## 2021-06-14 NOTE — ED Notes (Signed)
Pelvic cart at bedside. 

## 2021-06-14 NOTE — ED Notes (Signed)
Patient transported to CT 

## 2021-06-15 LAB — GC/CHLAMYDIA PROBE AMP (~~LOC~~) NOT AT ARMC
Chlamydia: NEGATIVE
Comment: NEGATIVE
Comment: NORMAL
Neisseria Gonorrhea: NEGATIVE

## 2021-06-15 LAB — URINE CULTURE

## 2021-06-16 ENCOUNTER — Ambulatory Visit (HOSPITAL_COMMUNITY): Payer: Medicaid Other

## 2021-07-13 ENCOUNTER — Ambulatory Visit (INDEPENDENT_AMBULATORY_CARE_PROVIDER_SITE_OTHER): Payer: Medicaid Other | Admitting: Obstetrics and Gynecology

## 2021-07-13 ENCOUNTER — Encounter: Payer: Self-pay | Admitting: Obstetrics and Gynecology

## 2021-07-13 VITALS — BP 119/83 | HR 105 | Ht 66.0 in | Wt 162.8 lb

## 2021-07-13 DIAGNOSIS — Z1331 Encounter for screening for depression: Secondary | ICD-10-CM

## 2021-07-13 DIAGNOSIS — D250 Submucous leiomyoma of uterus: Secondary | ICD-10-CM

## 2021-07-13 DIAGNOSIS — N888 Other specified noninflammatory disorders of cervix uteri: Secondary | ICD-10-CM | POA: Diagnosis not present

## 2021-07-13 DIAGNOSIS — Z01419 Encounter for gynecological examination (general) (routine) without abnormal findings: Secondary | ICD-10-CM

## 2021-07-13 DIAGNOSIS — D259 Leiomyoma of uterus, unspecified: Secondary | ICD-10-CM | POA: Insufficient documentation

## 2021-07-13 NOTE — Progress Notes (Signed)
NGYN presents to establish care. Last PAP unknown. Pt seen in ED 5/24 for uterine prolapse. No change since ED visit. Pt c/o of "vaginal tissue" in the toilet when she goes to the bathroom. Pt also c/o of pressure in the vagina when using the bathroom and states it is very uncomfortable.

## 2021-07-14 ENCOUNTER — Encounter: Payer: Self-pay | Admitting: Obstetrics and Gynecology

## 2021-07-19 ENCOUNTER — Ambulatory Visit (INDEPENDENT_AMBULATORY_CARE_PROVIDER_SITE_OTHER): Payer: Medicaid Other | Admitting: Licensed Clinical Social Worker

## 2021-07-19 DIAGNOSIS — F4321 Adjustment disorder with depressed mood: Secondary | ICD-10-CM | POA: Diagnosis not present

## 2021-07-20 ENCOUNTER — Ambulatory Visit (HOSPITAL_BASED_OUTPATIENT_CLINIC_OR_DEPARTMENT_OTHER): Payer: Medicaid Other

## 2021-07-20 ENCOUNTER — Ambulatory Visit (HOSPITAL_BASED_OUTPATIENT_CLINIC_OR_DEPARTMENT_OTHER)
Admission: RE | Admit: 2021-07-20 | Discharge: 2021-07-20 | Disposition: A | Discharge status: Home / Self Care | Payer: Medicaid Other | Source: Ambulatory Visit | Attending: Obstetrics and Gynecology | Admitting: Obstetrics and Gynecology

## 2021-07-20 DIAGNOSIS — D250 Submucous leiomyoma of uterus: Secondary | ICD-10-CM | POA: Insufficient documentation

## 2021-07-20 NOTE — BH Specialist Note (Signed)
Integrated Behavioral Health via Telemedicine Visit  07/20/2021 Shalondra Wunschel [id number]  Number of [facility] Clinician visits: 1 Session Start time: 1100am Session End time: 1141am Total time in minutes: 41 mins via mychart video  Referring Provider: Dr. Elgie Congo  Patient/Family location: Home  [facility] Provider location: [place]  All persons participating in visit: Pt S As[place] and LCSW A. Vadis Slabach  Types of Service: Individual psychotherapy and Video visit  I connected with [name] and/or [place] via  Telephone or Video Enabled Telemedicine Application  (Video is Caregility application) and verified that I am speaking with the correct person using two identifiers. Discussed confidentiality: Yes   I discussed the limitations of telemedicine and the availability of in person appointments.  Discussed there is a possibility of technology failure and discussed alternative modes of communication if that failure occurs.  I discussed that engaging in this telemedicine visit, they consent to the provision of behavioral healthcare and the services will be billed under their insurance.  Patient and/or legal guardian expressed understanding and consented to Telemedicine visit: Yes   Presenting Concerns: Patient and/or family reports the following symptoms/concerns: adjustment disorder with depressed mood  Duration of problem: over one year; Severity of problem: mild  Patient and/or Family's Strengths/Protective Factors: Concrete supports in place (healthy food, safe environments, etc.)  Goals Addressed: Patient will:  Reduce symptoms of: depression and stress   Increase knowledge and/or ability of: coping skills and stress reduction   Demonstrate ability to: Increase healthy adjustment to current life circumstances  Progress towards Goals: Ongoing  Interventions: Interventions utilized:  Supportive Counseling Standardized Assessments completed: PHQ  9  Patient and/or Family Response: Ms. Werden responded well to mychart visit   Assessment: Patient currently experiencing depression.    Patient may benefit from outpatient behavioral health.  Plan: Follow up with behavioral health clinician on : as needed  Behavioral recommendations: Prioritize rest, discuss co-parenting with children father, reduce social isolation and prioritize task to reduce stress Referral(s): [place] (In Clinic)  I discussed the assessment and treatment plan with the patient and/or parent/guardian. They were provided an opportunity to ask questions and all were answered. They agreed with the plan and demonstrated an understanding of the instructions.   They were advised to call back or seek an in-person evaluation if the symptoms worsen or if the condition fails to improve as anticipated.  Lynnea Ferrier, LCSW

## 2021-07-28 ENCOUNTER — Ambulatory Visit: Payer: Medicaid Other | Admitting: Podiatry

## 2021-08-15 IMAGING — CT CT ABD-PELV W/ CM
2 of 4 series · 12 of 46 positions shown, 14 images · IV contrast (iopamidol)
Comparison: None.

CLINICAL DATA: Generalized abdominal pain for 1 year, acid reflux

EXAM:
CT ABDOMEN AND PELVIS WITH CONTRAST
TECHNIQUE: Multidetector CT imaging of the abdomen and pelvis was performed
using the standard protocol following bolus administration of
intravenous contrast.
CONTRAST:  100mL BJWTLU-N33 IOPAMIDOL (BJWTLU-N33) INJECTION 61%

[Series 2: abd pelvis 5.00 br40 s3 axial · axial · 0.54mm/px · z∈[+1156,+1516]mm · 9 of 86 slices shown, 11 images]
[im 7/86  soft-tissue]
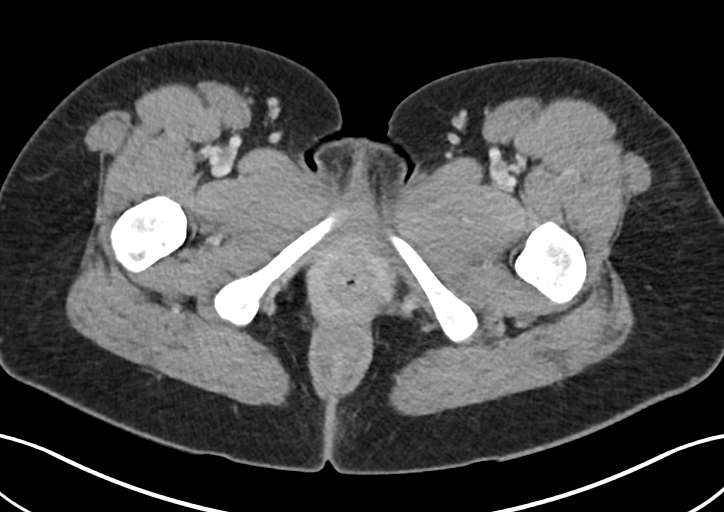
[im 7/86  bone]
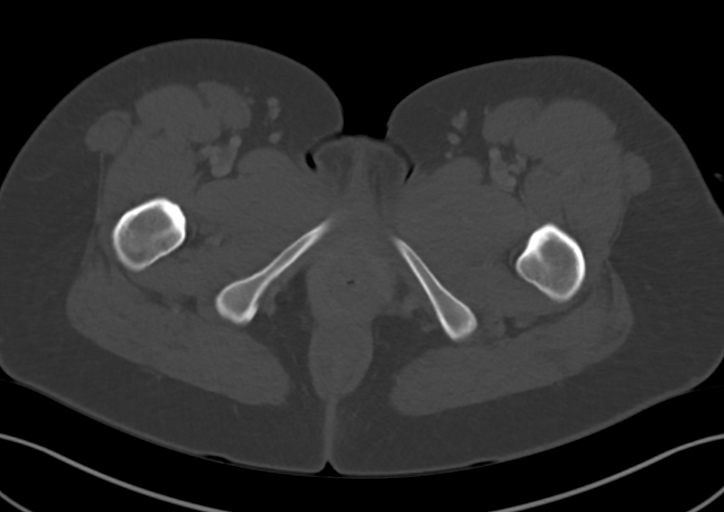
[im 17/86  soft-tissue]
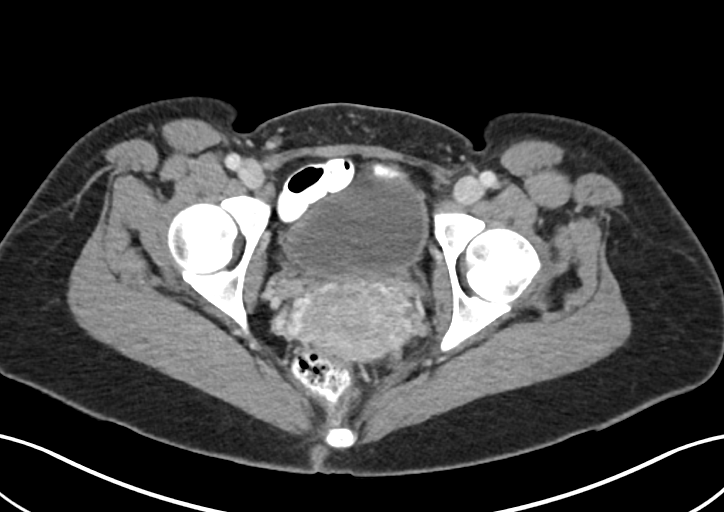
[im 23/86  soft-tissue]
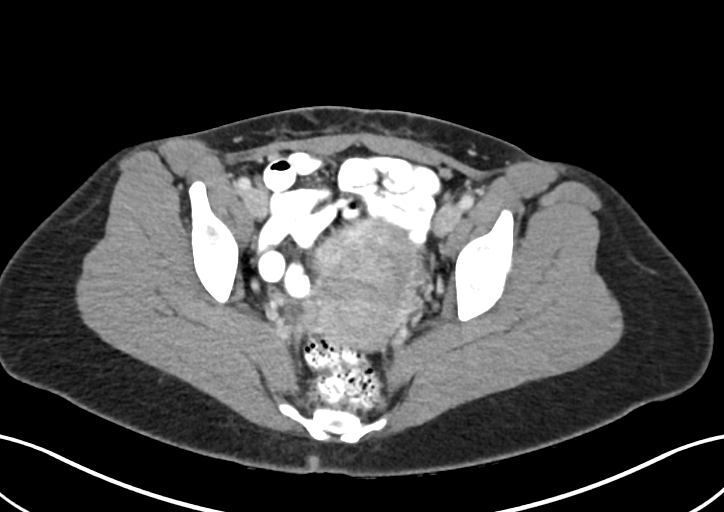
[im 33/86  soft-tissue]
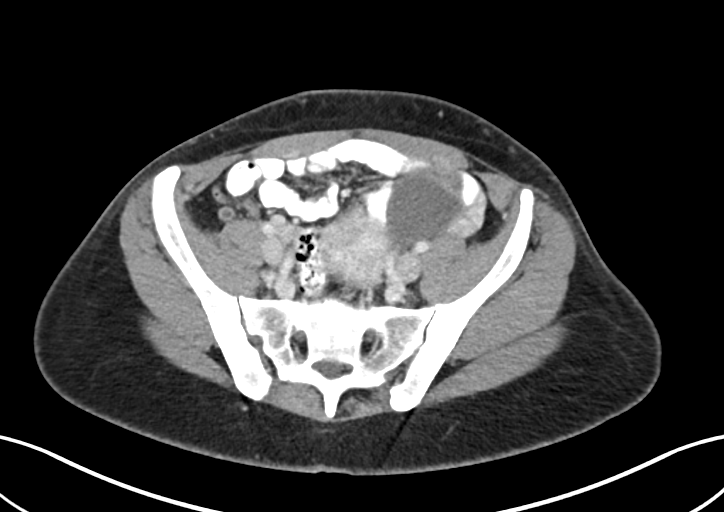
[im 43/86  soft-tissue]
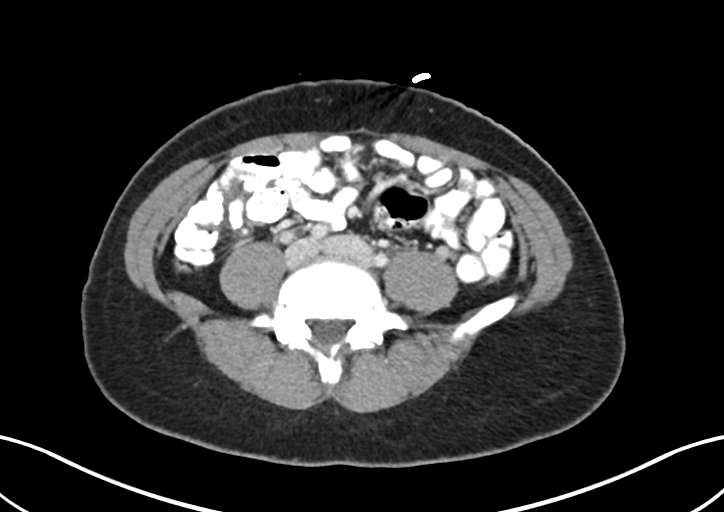
[im 53/86  soft-tissue]
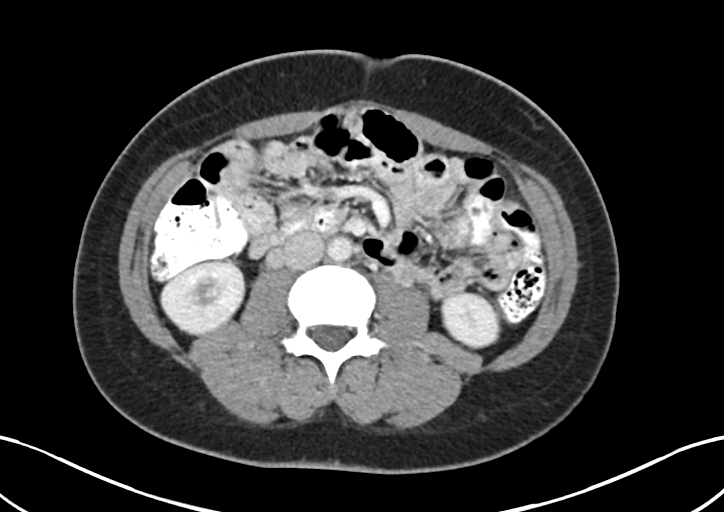
[im 63/86  soft-tissue]
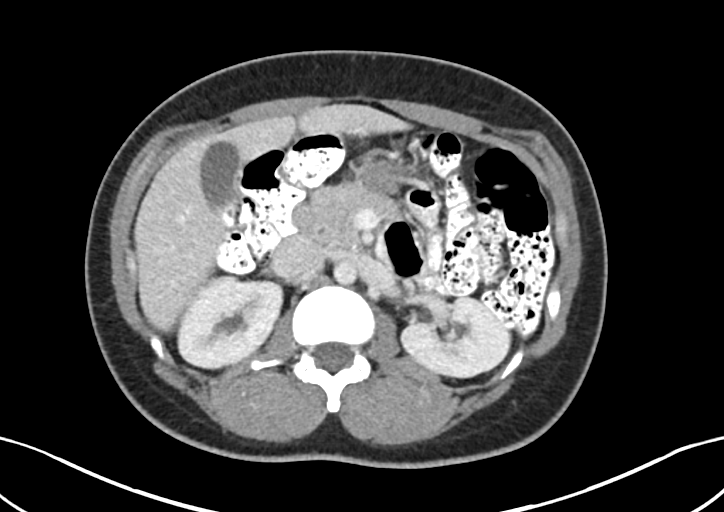
[im 69/86  soft-tissue]
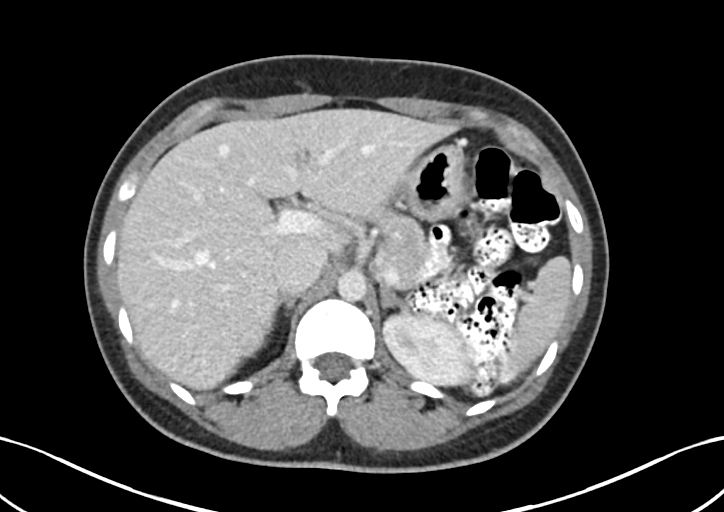
[im 79/86  soft-tissue]
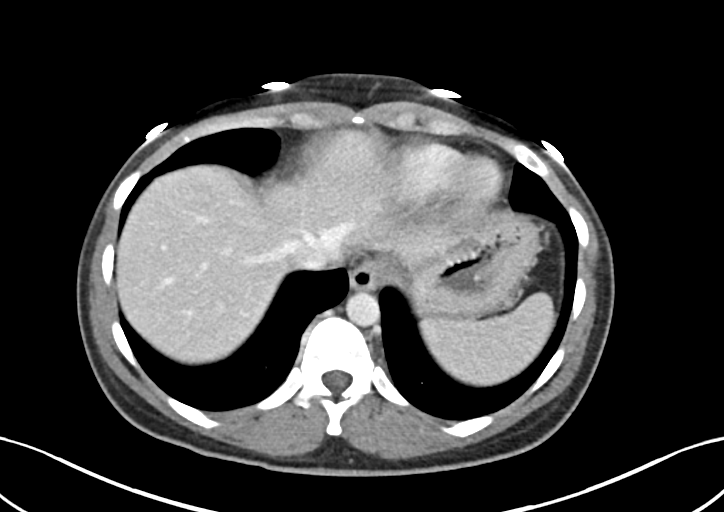
[im 79/86  bone]
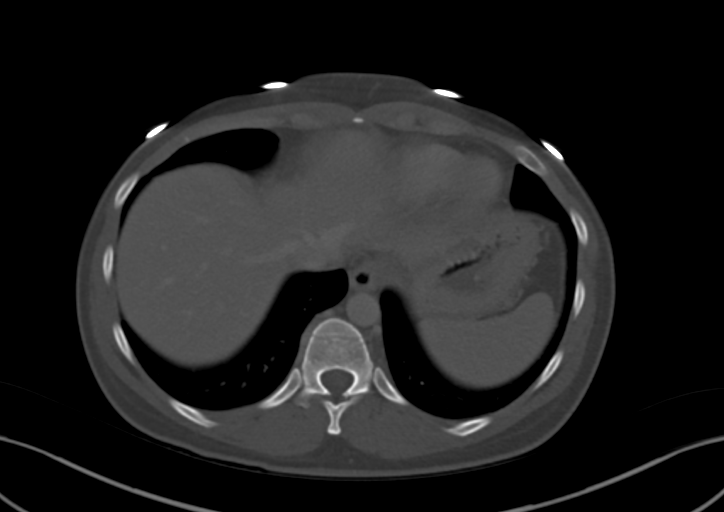

[Series 6: abd pelvis 2.00 br40 s3 cor · coronal · 0.77mm/px · 3 of 137 slices shown]
[im 46/137  soft-tissue]
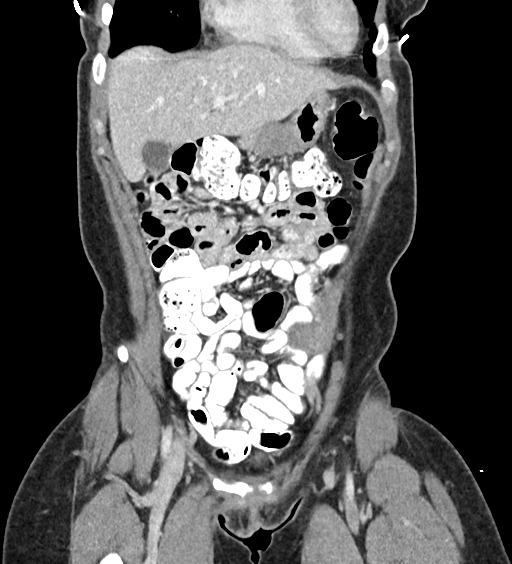
[im 61/137  soft-tissue]
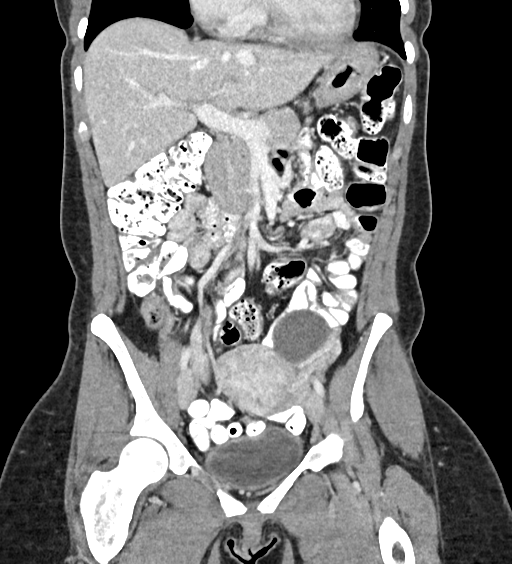
[im 76/137  soft-tissue]
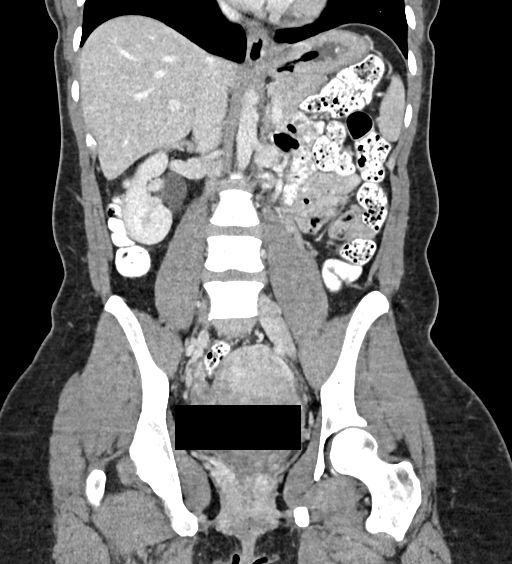

[12 of 46 positions shown; findings below may reference images not displayed]

FINDINGS: Lower chest: No acute abnormality.

Hepatobiliary: No solid liver abnormality is seen. No gallstones,
gallbladder wall thickening, or biliary dilatation.

Pancreas: Unremarkable. No pancreatic ductal dilatation or
surrounding inflammatory changes.

Spleen: Normal in size without significant abnormality.

Adrenals/Urinary Tract: Adrenal glands are unremarkable. Kidneys are
normal, without renal calculi, solid lesion, or hydronephrosis.
Bladder is unremarkable.

Stomach/Bowel: Stomach is within normal limits. Appendix appears
normal. No evidence of bowel wall thickening, distention, or
inflammatory changes.

Vascular/Lymphatic: No significant vascular findings are present. No
enlarged abdominal or pelvic lymph nodes.

Reproductive: There is a 4.7 cm fluid attenuation cyst of the left
ovary. Fluid in the endometrial cavity, functional in the
reproductive age setting.

Other: No abdominal wall hernia or abnormality. No abdominopelvic
ascites.

Musculoskeletal: No acute or significant osseous findings.
IMPRESSION: 1. No definite acute CT findings of the abdomen or pelvis to explain
abdominal pain.
2. There is a 4.7 cm fluid attenuation cyst of the left ovary,
benign and functional in the reproductive age setting, although
potentially symptomatic due to size. Consider pelvic ultrasound to
further evaluate if pain is thought to be referable.

## 2021-08-16 ENCOUNTER — Encounter: Payer: Self-pay | Admitting: Obstetrics and Gynecology

## 2021-08-16 ENCOUNTER — Ambulatory Visit (INDEPENDENT_AMBULATORY_CARE_PROVIDER_SITE_OTHER): Payer: Medicaid Other | Admitting: Obstetrics and Gynecology

## 2021-08-16 VITALS — BP 120/83 | HR 118 | Ht 65.0 in | Wt 160.0 lb

## 2021-08-16 DIAGNOSIS — Z01818 Encounter for other preprocedural examination: Secondary | ICD-10-CM | POA: Diagnosis not present

## 2021-08-16 DIAGNOSIS — D259 Leiomyoma of uterus, unspecified: Secondary | ICD-10-CM | POA: Diagnosis not present

## 2021-08-16 DIAGNOSIS — D250 Submucous leiomyoma of uterus: Secondary | ICD-10-CM | POA: Diagnosis not present

## 2021-08-16 NOTE — Progress Notes (Signed)
36 y.o presents for FU to discuss treatment plan.

## 2021-08-16 NOTE — Progress Notes (Signed)
OB/GYN Pre-Op History and Physical  Wanda Nunez is a 36 y.o. [id number] presenting for preoperative appointment for prolapsed uterine fibroid.  Discussed risks and benefits of the procedure including bleeding, infection,involvement of other organs.  Pt is adamant that she does not want a hysterectomy.  Pt made aware that is not our intent.       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    Past Surgical History:  Procedure Laterality Date   CESAREAN SECTION     x2    OB History  Gravida Para Term Preterm AB Living  '2 2 2 '$ 0 0 2  SAB IAB Ectopic Multiple Live Births  0 0 0 0 2    # Outcome Date GA Lbr Len/2nd Weight Sex Delivery Anes PTL Lv  2 Term 03/29/16    M CS-Unspec   LIV  1 Term 02/10/10    M CS-Unspec   LIV    Social History   Socioeconomic History   Marital status: Single    Spouse name: Not on file   Number of children: Not on file   Years of education: Not on file   Highest education level: Not on file  Occupational History   Not on file  Tobacco Use   Smoking status: Never   Smokeless tobacco: Never  Vaping Use   Vaping Use: Never used  Substance and Sexual Activity   Alcohol use: No   Drug use: No   Sexual activity: Yes    Birth control/protection: Pill  Other Topics Concern   Not on file  Social History Narrative   Not on file   Social Determinants of Health   Financial Resource Strain: Not on file  Food Insecurity: Not on file  Transportation Needs: Not on file  Physical Activity: Not on file  Stress: Not on file  Social Connections: Not on file    Family History  Problem Relation Age of Onset   Diabetes Mother    Hypertension Mother    Bladder Cancer Mother    Healthy Father    Hypertension Brother    Vasculitis Brother    Hypertension Maternal Grandmother    Peripheral vascular disease Maternal Grandmother    Stroke Maternal Grandfather    CAD Neg Hx    CVA Neg Hx    Cancer Neg Hx     (Not in a hospital  admission)   No Known Allergies  Review of Systems: Negative except for what is mentioned in HPI.     Physical Exam: BP 120/83   Pulse (!) 118   Ht '5\' 5"'$  (1.651 m)   Wt 160 lb (72.6 kg)   LMP 08/11/2021 (Exact Date)   BMI 26.63 kg/m  CONSTITUTIONAL: Well-developed, well-nourished female in no acute distress.  HENT:  Normocephalic, atraumatic, External right and left ear normal. Oropharynx is clear and moist EYES: Conjunctivae and EOM are normal.  NECK: Normal range of motion, supple, no masses SKIN: Skin is warm and dry. No rash noted. Not diaphoretic. No erythema. No pallor. [place]: Alert and oriented to person, place, and time. Normal reflexes, muscle tone coordination. No cranial nerve deficit noted. PSYCHIATRIC: Normal mood and affect. Normal behavior. Normal judgment and thought content. CARDIOVASCULAR: Normal heart rate noted, regular rhythm RESPIRATORY: Effort and breath sounds normal, no problems with respiration noted ABDOMEN: Soft, nontender, nondistended. Well-healed Pfannenstiel incision. PELVIC: SVE: prolapsed fibroid easily visualized, surface very friable MUSCULOSKELETAL: Normal range of motion. No edema and no tenderness. 2+ distal  pulses.   Pertinent Labs/Studies:   No results found for this or any previous visit (from the past 72 hour(s)).     Assessment and Plan :Wanda Nunez is a 36 y.o. [id number] here for preoperative appointment for resection of prolapsed fibroid.   Plan for Vaginal examination, possible endoloop, resection of fibroid followed by hysteroscopy NPO Admission labs ordered VS per protocol   Lynnda Shields, M.D. Attending [place], [facility] for [employer], [employer]

## 2021-08-16 NOTE — H&P (View-Only) (Signed)
36 y.o presents for FU to discuss treatment plan.

## 2021-08-30 ENCOUNTER — Other Ambulatory Visit: Payer: Self-pay

## 2021-08-30 ENCOUNTER — Encounter (HOSPITAL_BASED_OUTPATIENT_CLINIC_OR_DEPARTMENT_OTHER): Payer: Self-pay | Admitting: Obstetrics and Gynecology

## 2021-08-30 NOTE — Progress Notes (Signed)
Spoke w/ via phone for pre-op interview---pt Lab needs dos----   cbc, t & s, urine preg poct            Lab results-----lov neurlology 12-16-2017 epic COVID test -----patient states asymptomatic no test needed Arrive at -------730 am 09-05-2021 NPO after MN NO Solid Food.  Clear liquids from MN until---630 am Med rec completed Medications to take morning of surgery -----pantoprazole Diabetic medication -----n/a Patient instructed no nail polish to be worn day of surgery Patient instructed to bring photo id and insurance card day of surgery Patient aware to have Driver (ride ) / caregiver   Wanda Nunez  (family  per pt)  for 24 hours after surgery  Patient Special Instructions -----none Pre-Op special Istructions -----none Patient verbalized understanding of instructions that were given at this phone interview. Patient denies shortness of breath, chest pain, fever, cough at this phone interview.

## 2021-09-05 ENCOUNTER — Encounter (HOSPITAL_BASED_OUTPATIENT_CLINIC_OR_DEPARTMENT_OTHER)
Admission: RE | Disposition: A | Discharge status: Home / Self Care | Payer: Self-pay | Source: Home / Self Care | Attending: Obstetrics and Gynecology

## 2021-09-05 ENCOUNTER — Other Ambulatory Visit: Payer: Self-pay | Admitting: Obstetrics and Gynecology

## 2021-09-05 ENCOUNTER — Ambulatory Visit (HOSPITAL_BASED_OUTPATIENT_CLINIC_OR_DEPARTMENT_OTHER)
Admission: RE | Admit: 2021-09-05 | Discharge: 2021-09-05 | Disposition: A | Discharge status: Home / Self Care | Payer: Medicaid Other | Attending: Obstetrics and Gynecology | Admitting: Obstetrics and Gynecology

## 2021-09-05 ENCOUNTER — Ambulatory Visit (HOSPITAL_BASED_OUTPATIENT_CLINIC_OR_DEPARTMENT_OTHER): Payer: Medicaid Other | Admitting: Certified Registered Nurse Anesthetist

## 2021-09-05 ENCOUNTER — Other Ambulatory Visit: Payer: Self-pay

## 2021-09-05 ENCOUNTER — Encounter (HOSPITAL_BASED_OUTPATIENT_CLINIC_OR_DEPARTMENT_OTHER): Payer: Self-pay | Admitting: Obstetrics and Gynecology

## 2021-09-05 DIAGNOSIS — D250 Submucous leiomyoma of uterus: Secondary | ICD-10-CM | POA: Insufficient documentation

## 2021-09-05 DIAGNOSIS — Z01818 Encounter for other preprocedural examination: Secondary | ICD-10-CM

## 2021-09-05 DIAGNOSIS — Z5309 Procedure and treatment not carried out because of other contraindication: Secondary | ICD-10-CM | POA: Diagnosis not present

## 2021-09-05 DIAGNOSIS — D649 Anemia, unspecified: Secondary | ICD-10-CM

## 2021-09-05 HISTORY — DX: Gastro-esophageal reflux disease without esophagitis: K21.9

## 2021-09-05 HISTORY — DX: Presence of spectacles and contact lenses: Z97.3

## 2021-09-05 HISTORY — DX: Anemia, unspecified: D64.9

## 2021-09-05 HISTORY — DX: Depression, unspecified: F32.A

## 2021-09-05 HISTORY — DX: Anxiety disorder, unspecified: F41.9

## 2021-09-05 LAB — CBC
HCT: 23 % — ABNORMAL LOW (ref 36.0–46.0)
Hemoglobin: 6.2 g/dL — CL (ref 12.0–15.0)
MCH: 16.3 pg — ABNORMAL LOW (ref 26.0–34.0)
MCHC: 27 g/dL — ABNORMAL LOW (ref 30.0–36.0)
MCV: 60.4 fL — ABNORMAL LOW (ref 80.0–100.0)
Platelets: 196 10*3/uL (ref 150–400)
RBC: 3.81 MIL/uL — ABNORMAL LOW (ref 3.87–5.11)
RDW: 19.1 % — ABNORMAL HIGH (ref 11.5–15.5)
WBC: 4.8 10*3/uL (ref 4.0–10.5)
nRBC: 0 % (ref 0.0–0.2)

## 2021-09-05 LAB — TYPE AND SCREEN
ABO/RH(D): O POS
Antibody Screen: NEGATIVE

## 2021-09-05 LAB — ABO/RH: ABO/RH(D): O POS

## 2021-09-05 LAB — POCT PREGNANCY, URINE: Preg Test, Ur: NEGATIVE

## 2021-09-05 SURGERY — MYOMECTOMY, UTERUS, VAGINAL APPROACH
Anesthesia: General

## 2021-09-05 MED ORDER — ONDANSETRON HCL 4 MG/2ML IJ SOLN
INTRAMUSCULAR | Status: AC
  Filled 2021-09-05: qty 2

## 2021-09-05 MED ORDER — KETOROLAC TROMETHAMINE 15 MG/ML IJ SOLN: 15.0000 mg | INTRAMUSCULAR | Status: DC

## 2021-09-05 MED ORDER — LIDOCAINE HCL (PF) 2 % IJ SOLN
INTRAMUSCULAR | Status: AC
  Filled 2021-09-05: qty 5

## 2021-09-05 MED ORDER — MIDAZOLAM HCL 2 MG/2ML IJ SOLN
INTRAMUSCULAR | Status: AC
  Filled 2021-09-05: qty 2

## 2021-09-05 MED ORDER — LACTATED RINGERS IV SOLN: INTRAVENOUS | Status: DC

## 2021-09-05 MED ORDER — CEFAZOLIN SODIUM-DEXTROSE 2-4 GM/100ML-% IV SOLN
INTRAVENOUS | Status: AC
  Filled 2021-09-05: qty 100

## 2021-09-05 MED ORDER — CEFAZOLIN SODIUM-DEXTROSE 2-4 GM/100ML-% IV SOLN: 2.0000 g | INTRAVENOUS | Status: DC

## 2021-09-05 MED ORDER — LIDOCAINE HCL 1 % IJ SOLN
INTRAMUSCULAR | Status: AC
  Filled 2021-09-05: qty 20

## 2021-09-05 MED ORDER — VASOPRESSIN 20 UNIT/ML IV SOLN
INTRAVENOUS | Status: AC
  Filled 2021-09-05: qty 1

## 2021-09-05 MED ORDER — ACETAMINOPHEN 500 MG PO TABS
1000.0000 mg | ORAL_TABLET | ORAL | Status: AC
  Administered 2021-09-05: 1000 mg via ORAL

## 2021-09-05 MED ORDER — PROPOFOL 10 MG/ML IV BOLUS
INTRAVENOUS | Status: AC
  Filled 2021-09-05: qty 20

## 2021-09-05 MED ORDER — POVIDONE-IODINE 10 % EX SWAB: 2.0000 | Freq: Once | CUTANEOUS | Status: DC

## 2021-09-05 MED ORDER — SOD CITRATE-CITRIC ACID 500-334 MG/5ML PO SOLN: 30.0000 mL | ORAL | Status: DC

## 2021-09-05 MED ORDER — ACETAMINOPHEN 500 MG PO TABS
ORAL_TABLET | ORAL | Status: AC
  Filled 2021-09-05: qty 2

## 2021-09-05 MED ORDER — DEXAMETHASONE SODIUM PHOSPHATE 10 MG/ML IJ SOLN
INTRAMUSCULAR | Status: AC
  Filled 2021-09-05: qty 1

## 2021-09-05 MED ORDER — FENTANYL CITRATE (PF) 100 MCG/2ML IJ SOLN
INTRAMUSCULAR | Status: AC
  Filled 2021-09-05: qty 2

## 2021-09-05 MED ORDER — ROCURONIUM BROMIDE 10 MG/ML (PF) SYRINGE
PREFILLED_SYRINGE | INTRAVENOUS | Status: AC
  Filled 2021-09-05: qty 10

## 2021-09-05 SURGICAL SUPPLY — 9 items
CATH ROBINSON RED A/P 16FR (CATHETERS) IMPLANT
DEVICE MYOSURE LITE (MISCELLANEOUS) IMPLANT
DEVICE MYOSURE REACH (MISCELLANEOUS) IMPLANT
DRSG TELFA 3X8 NADH (GAUZE/BANDAGES/DRESSINGS) IMPLANT
GAUZE 4X4 16PLY ~~LOC~~+RFID DBL (SPONGE) IMPLANT
GLOVE BIOGEL PI IND STRL 7.0 (GLOVE) IMPLANT
GLOVE BIOGEL PI INDICATOR 7.0 (GLOVE)
MYOSURE XL FIBROID (MISCELLANEOUS)
SYSTEM TISS REMOVAL MYOSURE XL (MISCELLANEOUS) IMPLANT

## 2021-09-05 NOTE — Interval H&P Note (Signed)
History and Physical Interval Note:  09/05/2021 10:03 AM  [name]  has presented today for surgery, with the diagnosis of Submucous leiomyoma.  The various methods of treatment have been discussed with the patient and family. After consideration of risks, benefits and other options for treatment, the patient has consented to  Procedure(s): VAGINAL MYOMECTOMY (N/A) [place] (N/A) as a surgical intervention. Discussed with patient the usage of endoloop, vasopressin and possible piecemeal myomectomy.  Again reiterated risk of bleeding, infection, involvement of other organs or possible hysterectomy.  Will use TXA during the case.   The patient's history has been reviewed, patient examined, no change in status, stable for surgery.  I have reviewed the patient's chart and labs.  Questions were answered to the patient's satisfaction.     Griffin Basil

## 2021-09-05 NOTE — Anesthesia Preprocedure Evaluation (Deleted)
Anesthesia Evaluation  Patient identified by MRN, date of birth, ID band Patient awake    Reviewed: Allergy & Precautions, NPO status , Patient's Chart, lab work & pertinent test results  Airway Mallampati: II  TM Distance: >3 FB Neck ROM: Full    Dental  (+) Dental Advisory Given, Teeth Intact, Chipped,    Pulmonary former smoker,    Pulmonary exam normal breath sounds clear to auscultation       Cardiovascular negative cardio ROS Normal cardiovascular exam Rhythm:Regular Rate:Normal     Neuro/Psych PSYCHIATRIC DISORDERS Anxiety Depression negative neurological ROS     GI/Hepatic Neg liver ROS, GERD  Poorly Controlled,  Endo/Other  negative endocrine ROS  Renal/GU negative Renal ROS     Musculoskeletal negative musculoskeletal ROS (+)   Abdominal   Peds  Hematology  (+) Blood dyscrasia, anemia ,   Anesthesia Other Findings   Reproductive/Obstetrics                            Anesthesia Physical Anesthesia Plan  ASA: 2  Anesthesia Plan: General   Post-op Pain Management: Tylenol PO (pre-op)*   Induction: Intravenous  PONV Risk Score and Plan: 4 or greater and Ondansetron, Treatment may vary due to age or medical condition, Midazolam and Dexamethasone  Airway Management Planned: Oral ETT  Additional Equipment:   Intra-op Plan:   Post-operative Plan: Extubation in OR  Informed Consent: I have reviewed the patients History and Physical, chart, labs and discussed the procedure including the risks, benefits and alternatives for the proposed anesthesia with the patient or authorized representative who has indicated his/her understanding and acceptance.     Dental advisory given  Plan Discussed with: CRNA  Anesthesia Plan Comments:        Anesthesia Quick Evaluation

## 2021-09-05 NOTE — Progress Notes (Signed)
Orders placed for blood transfusion, will reschedule case hopefully soon after the procedure.

## 2021-09-05 NOTE — Progress Notes (Signed)
Dr. Elgie Congo aware of hgb 6.2. Spoke with pt concerning surgical risk and need to cancel surgery. Counseled by RN about foods to boost hgb such as dark, green leafy vegetables and dried beans. Wanda Nunez called for pick up after dressing. Sipping on ginger ale.

## 2021-09-05 NOTE — Progress Notes (Signed)
Surgery canceled due to extremely low hemoglobin noted minutes before the start of the case.  Pt informed in person of the reason for cancellation.  Will try to set up outpatient transfusion of blood and then reschedule the procedure.   Lynnda Shields, MD

## 2021-09-07 ENCOUNTER — Encounter (HOSPITAL_COMMUNITY)
Admission: RE | Admit: 2021-09-07 | Discharge: 2021-09-07 | Disposition: A | Discharge status: Home / Self Care | Payer: Medicaid Other | Source: Ambulatory Visit | Attending: Obstetrics and Gynecology | Admitting: Obstetrics and Gynecology

## 2021-09-07 DIAGNOSIS — D649 Anemia, unspecified: Secondary | ICD-10-CM | POA: Diagnosis present

## 2021-09-07 LAB — HEMOGLOBIN AND HEMATOCRIT, BLOOD
HCT: 31.9 % — ABNORMAL LOW (ref 36.0–46.0)
Hemoglobin: 9.2 g/dL — ABNORMAL LOW (ref 12.0–15.0)

## 2021-09-07 LAB — PREPARE RBC (CROSSMATCH)

## 2021-09-07 MED ORDER — DIPHENHYDRAMINE HCL 25 MG PO CAPS: 25.0000 mg | ORAL_CAPSULE | Freq: Once | ORAL | Status: AC

## 2021-09-07 MED ORDER — DIPHENHYDRAMINE HCL 25 MG PO CAPS
ORAL_CAPSULE | ORAL | Status: AC
  Administered 2021-09-07: 25 mg via ORAL
  Filled 2021-09-07: qty 1

## 2021-09-07 MED ORDER — SODIUM CHLORIDE 0.9% IV SOLUTION: Freq: Once | INTRAVENOUS | Status: DC

## 2021-09-07 MED ORDER — ACETAMINOPHEN 325 MG PO TABS
ORAL_TABLET | ORAL | Status: AC
  Administered 2021-09-07: 650 mg via ORAL
  Filled 2021-09-07: qty 2

## 2021-09-07 MED ORDER — FUROSEMIDE 10 MG/ML IJ SOLN
INTRAMUSCULAR | Status: AC
  Administered 2021-09-07: 20 mg via INTRAVENOUS
  Filled 2021-09-07: qty 2

## 2021-09-07 MED ORDER — ACETAMINOPHEN 325 MG PO TABS: 650.0000 mg | ORAL_TABLET | Freq: Once | ORAL | Status: AC

## 2021-09-07 MED ORDER — FUROSEMIDE 10 MG/ML IJ SOLN: 20.0000 mg | Freq: Once | INTRAMUSCULAR | Status: AC

## 2021-09-08 ENCOUNTER — Encounter (HOSPITAL_COMMUNITY): Payer: Medicaid Other

## 2021-09-08 LAB — TYPE AND SCREEN
ABO/RH(D): O POS
Antibody Screen: NEGATIVE
Unit division: 0
Unit division: 0
Unit division: 0

## 2021-09-08 LAB — BPAM RBC
Blood Product Expiration Date: 202309172359
Blood Product Expiration Date: 202309172359
Blood Product Expiration Date: 202309182359
ISSUE DATE / TIME: 202308170955
ISSUE DATE / TIME: 202308171144
ISSUE DATE / TIME: 202308171319
Unit Type and Rh: 5100
Unit Type and Rh: 5100
Unit Type and Rh: 5100

## 2021-09-10 ENCOUNTER — Other Ambulatory Visit: Payer: Self-pay

## 2021-09-10 ENCOUNTER — Emergency Department (HOSPITAL_COMMUNITY)
Admission: EM | Admit: 2021-09-10 | Discharge: 2021-09-11 | Discharge status: Against Medical Advice | Payer: Medicaid Other | Attending: Emergency Medicine | Admitting: Emergency Medicine

## 2021-09-10 DIAGNOSIS — N938 Other specified abnormal uterine and vaginal bleeding: Secondary | ICD-10-CM | POA: Diagnosis not present

## 2021-09-10 DIAGNOSIS — N9489 Other specified conditions associated with female genital organs and menstrual cycle: Secondary | ICD-10-CM | POA: Insufficient documentation

## 2021-09-10 DIAGNOSIS — Z5321 Procedure and treatment not carried out due to patient leaving prior to being seen by health care provider: Secondary | ICD-10-CM | POA: Diagnosis not present

## 2021-09-10 LAB — BASIC METABOLIC PANEL
Anion gap: 7 (ref 5–15)
BUN: 9 mg/dL (ref 6–20)
CO2: 23 mmol/L (ref 22–32)
Calcium: 8.9 mg/dL (ref 8.9–10.3)
Chloride: 109 mmol/L (ref 98–111)
Creatinine, Ser: 0.81 mg/dL (ref 0.44–1.00)
GFR, Estimated: >60 mL/min (ref >60)
Glucose, Bld: 126 mg/dL — ABNORMAL HIGH (ref 70–99)
Potassium: 3.6 mmol/L (ref 3.5–5.1)
Sodium: 139 mmol/L (ref 135–145)

## 2021-09-10 LAB — CBC
HCT: 33.5 % — ABNORMAL LOW (ref 36.0–46.0)
Hemoglobin: 9.3 g/dL — ABNORMAL LOW (ref 12.0–15.0)
MCH: 18.7 pg — ABNORMAL LOW (ref 26.0–34.0)
MCHC: 27.8 g/dL — ABNORMAL LOW (ref 30.0–36.0)
MCV: 67.4 fL — ABNORMAL LOW (ref 80.0–100.0)
Platelets: 235 10*3/uL (ref 150–400)
RBC: 4.97 MIL/uL (ref 3.87–5.11)
RDW: 26.5 % — ABNORMAL HIGH (ref 11.5–15.5)
WBC: 7.4 10*3/uL (ref 4.0–10.5)
nRBC: 0 % (ref 0.0–0.2)

## 2021-09-10 LAB — URINALYSIS, ROUTINE W REFLEX MICROSCOPIC

## 2021-09-10 LAB — PREGNANCY, URINE: Preg Test, Ur: NEGATIVE

## 2021-09-10 LAB — URINALYSIS, MICROSCOPIC (REFLEX): RBC / HPF: >50 RBC/hpf (ref 0–5)

## 2021-09-10 NOTE — ED Triage Notes (Signed)
Pt here for vaginal bleeding that has been intermittent. Pt reports she had this last week and got a  blood transfusion on Thursday. Pt began spotting on Friday and started heavy bleeding yesterday. Today pt reports passing blood clots. Pt scheduled for uterine fibroid surgery on 9/12. Denies pain, endorses feeling lightheaded

## 2021-09-10 NOTE — ED Provider Triage Note (Signed)
Emergency Medicine Provider Triage Evaluation Note  Wanda Nunez , a 36 y.o. female  was evaluated in triage.  Pt complains of heavy vaginal bleeding.  Recently received blood transfusion secondary to heavy vaginal bleeding.  States  initially after following the transfusion she was just spotting however since then has become heavy.  Reports blood clots as well.  3 pads per day.  Some lightheadedness.  Denies abdominal pain, chest pain, shortness of breath.  Review of Systems  Positive: As above Negative: As above  Physical Exam  LMP 08/12/2021 (Exact Date)  Gen:   Awake, no distress   Resp:  Normal effort  MSK:   Moves extremities without difficulty  Other:    Medical Decision Making  Medically screening exam initiated at 8:27 PM.  Appropriate orders placed.  Shaheen Star was informed that the remainder of the evaluation will be completed by another provider, this initial triage assessment does not replace that evaluation, and the importance of remaining in the ED until their evaluation is complete.     Evlyn Courier, PA-C 09/10/21 2028

## 2021-09-10 NOTE — ED Notes (Signed)
Patient states the wait is too long and she is leaving 

## 2021-09-26 ENCOUNTER — Other Ambulatory Visit: Payer: Self-pay | Admitting: Obstetrics & Gynecology

## 2021-09-26 DIAGNOSIS — Z01818 Encounter for other preprocedural examination: Secondary | ICD-10-CM

## 2021-10-02 ENCOUNTER — Other Ambulatory Visit: Payer: Self-pay

## 2021-10-02 ENCOUNTER — Encounter (HOSPITAL_COMMUNITY): Payer: Self-pay | Admitting: Obstetrics & Gynecology

## 2021-10-02 NOTE — Progress Notes (Signed)
PCP - Rosana Fret List, FNP  ERAS Protcol - NPO  Anesthesia review: N  Patient verbally denies any shortness of breath, fever, cough and chest pain during phone call   -------------  SDW INSTRUCTIONS given:  Your procedure is scheduled on 10/03/21.  Report to [facility] Main Entrance "A" at 0700 A.M., and check in at the Admitting office.  Call this number if you have problems the morning of surgery:  [phone]   Remember:  Do not eat or drink after midnight the night before your surgery    Take these medicines the morning of surgery with A SIP OF WATER  N/A  As of today, STOP taking any Aspirin (unless otherwise instructed by your surgeon) Aleve, Naproxen, Ibuprofen, Motrin, Advil, Goody's, BC's, all herbal medications, fish oil, and all vitamins.                      Do not wear jewelry, make up, or nail polish            Do not wear lotions, powders, perfumes/colognes, or deodorant.            Do not shave 48 hours prior to surgery.  Men may shave face and neck.            Do not bring valuables to the hospital.            [facility] is not responsible for any belongings or valuables.  Do NOT Smoke (Tobacco/Vaping) 24 hours prior to your procedure If you use a CPAP at night, you may bring all equipment for your overnight stay.   Contacts, glasses, dentures or bridgework may not be worn into surgery.      For patients admitted to the hospital, discharge time will be determined by your treatment team.   Patients discharged the day of surgery will not be allowed to drive home, and someone needs to stay with them for 24 hours.    Special instructions:   - Preparing For Surgery  Before surgery, you can play an important role. Because skin is not sterile, your skin needs to be as free of germs as possible. You can reduce the number of germs on your skin by washing with CHG (chlorahexidine gluconate) Soap before surgery.  CHG is an antiseptic cleaner which kills  germs and bonds with the skin to continue killing germs even after washing.    Oral Hygiene is also important to reduce your risk of infection.  Remember - BRUSH YOUR TEETH THE MORNING OF SURGERY WITH YOUR REGULAR TOOTHPASTE  Please do not use if you have an allergy to CHG or antibacterial soaps. If your skin becomes reddened/irritated stop using the CHG.  Do not shave (including legs and underarms) for at least 48 hours prior to first CHG shower. It is OK to shave your face.  Please follow these instructions carefully.   Shower the NIGHT BEFORE SURGERY and the MORNING OF SURGERY with DIAL Soap.   Pat yourself dry with a CLEAN TOWEL.  Wear CLEAN PAJAMAS to bed the night before surgery  Place CLEAN SHEETS on your bed the night of your first shower and DO NOT SLEEP WITH PETS.   Day of Surgery: Please shower morning of surgery  Wear Clean/Comfortable clothing the morning of surgery Do not apply any deodorants/lotions.   Remember to brush your teeth WITH YOUR REGULAR TOOTHPASTE.   Questions were answered. Patient verbalized understanding of instructions.

## 2021-10-03 ENCOUNTER — Ambulatory Visit (HOSPITAL_COMMUNITY)
Admission: RE | Admit: 2021-10-03 | Discharge: 2021-10-03 | Disposition: A | Discharge status: Home / Self Care | Payer: Medicaid Other | Attending: Obstetrics & Gynecology | Admitting: Obstetrics & Gynecology

## 2021-10-03 ENCOUNTER — Encounter (HOSPITAL_COMMUNITY): Payer: Self-pay | Admitting: Obstetrics & Gynecology

## 2021-10-03 ENCOUNTER — Ambulatory Visit (HOSPITAL_BASED_OUTPATIENT_CLINIC_OR_DEPARTMENT_OTHER): Payer: Medicaid Other | Admitting: Anesthesiology

## 2021-10-03 ENCOUNTER — Encounter (HOSPITAL_COMMUNITY)
Admission: RE | Disposition: A | Discharge status: Home / Self Care | Payer: Self-pay | Source: Home / Self Care | Attending: Obstetrics & Gynecology

## 2021-10-03 ENCOUNTER — Other Ambulatory Visit: Payer: Self-pay

## 2021-10-03 ENCOUNTER — Ambulatory Visit (HOSPITAL_COMMUNITY): Payer: Medicaid Other | Admitting: Anesthesiology

## 2021-10-03 DIAGNOSIS — D250 Submucous leiomyoma of uterus: Secondary | ICD-10-CM

## 2021-10-03 DIAGNOSIS — K219 Gastro-esophageal reflux disease without esophagitis: Secondary | ICD-10-CM | POA: Insufficient documentation

## 2021-10-03 DIAGNOSIS — N921 Excessive and frequent menstruation with irregular cycle: Secondary | ICD-10-CM | POA: Insufficient documentation

## 2021-10-03 DIAGNOSIS — D630 Anemia in neoplastic disease: Secondary | ICD-10-CM | POA: Diagnosis not present

## 2021-10-03 DIAGNOSIS — Z87891 Personal history of nicotine dependence: Secondary | ICD-10-CM | POA: Diagnosis not present

## 2021-10-03 DIAGNOSIS — D500 Iron deficiency anemia secondary to blood loss (chronic): Secondary | ICD-10-CM | POA: Insufficient documentation

## 2021-10-03 DIAGNOSIS — Z01818 Encounter for other preprocedural examination: Secondary | ICD-10-CM

## 2021-10-03 HISTORY — PX: MYOMECTOMY: SHX85

## 2021-10-03 LAB — CBC
HCT: 32.9 % — ABNORMAL LOW (ref 36.0–46.0)
Hemoglobin: 9.5 g/dL — ABNORMAL LOW (ref 12.0–15.0)
MCH: 19 pg — ABNORMAL LOW (ref 26.0–34.0)
MCHC: 28.9 g/dL — ABNORMAL LOW (ref 30.0–36.0)
MCV: 65.7 fL — ABNORMAL LOW (ref 80.0–100.0)
Platelets: 297 10*3/uL (ref 150–400)
RBC: 5.01 MIL/uL (ref 3.87–5.11)
RDW: 26.2 % — ABNORMAL HIGH (ref 11.5–15.5)
WBC: 9.5 10*3/uL (ref 4.0–10.5)
nRBC: 0 % (ref 0.0–0.2)

## 2021-10-03 LAB — COMPREHENSIVE METABOLIC PANEL
ALT: 10 U/L (ref 0–44)
AST: 15 U/L (ref 15–41)
Albumin: 3.3 g/dL — ABNORMAL LOW (ref 3.5–5.0)
Alkaline Phosphatase: 37 U/L — ABNORMAL LOW (ref 38–126)
Anion gap: 6 (ref 5–15)
BUN: 9 mg/dL (ref 6–20)
CO2: 22 mmol/L (ref 22–32)
Calcium: 9.1 mg/dL (ref 8.9–10.3)
Chloride: 108 mmol/L (ref 98–111)
Creatinine, Ser: 0.77 mg/dL (ref 0.44–1.00)
GFR, Estimated: >60 mL/min (ref >60)
Glucose, Bld: 103 mg/dL — ABNORMAL HIGH (ref 70–99)
Potassium: 3.4 mmol/L — ABNORMAL LOW (ref 3.5–5.1)
Sodium: 136 mmol/L (ref 135–145)
Total Bilirubin: 0.6 mg/dL (ref 0.3–1.2)
Total Protein: 6.5 g/dL (ref 6.5–8.1)

## 2021-10-03 LAB — URINALYSIS, ROUTINE W REFLEX MICROSCOPIC
Bacteria, UA: NONE SEEN
Bilirubin Urine: NEGATIVE
Glucose, UA: NEGATIVE mg/dL
Ketones, ur: NEGATIVE mg/dL
Nitrite: NEGATIVE
Protein, ur: NEGATIVE mg/dL
Specific Gravity, Urine: 1.021 (ref 1.005–1.030)
pH: 5 (ref 5.0–8.0)

## 2021-10-03 LAB — RAPID HIV SCREEN (HIV 1/2 AB+AG)
HIV 1/2 Antibodies: NONREACTIVE
HIV-1 P24 Antigen - HIV24: NONREACTIVE

## 2021-10-03 LAB — TYPE AND SCREEN
ABO/RH(D): O POS
Antibody Screen: NEGATIVE

## 2021-10-03 LAB — HCG, QUANTITATIVE, PREGNANCY: hCG, Beta Chain, Quant, S: <1 m[IU]/mL (ref <5)

## 2021-10-03 SURGERY — MYOMECTOMY, UTERUS, VAGINAL APPROACH
Anesthesia: General

## 2021-10-03 MED ORDER — DEXMEDETOMIDINE HCL IN NACL 80 MCG/20ML IV SOLN
INTRAVENOUS | Status: AC
  Filled 2021-10-03: qty 20

## 2021-10-03 MED ORDER — PROPOFOL 10 MG/ML IV BOLUS
INTRAVENOUS | Status: DC | PRN
  Administered 2021-10-03: 200 mg via INTRAVENOUS

## 2021-10-03 MED ORDER — MIDAZOLAM HCL 2 MG/2ML IJ SOLN
INTRAMUSCULAR | Status: DC | PRN
  Administered 2021-10-03: 2 mg via INTRAVENOUS

## 2021-10-03 MED ORDER — PHENYLEPHRINE 80 MCG/ML (10ML) SYRINGE FOR IV PUSH (FOR BLOOD PRESSURE SUPPORT)
PREFILLED_SYRINGE | INTRAVENOUS | Status: AC
  Filled 2021-10-03: qty 10

## 2021-10-03 MED ORDER — PANTOPRAZOLE SODIUM 40 MG PO TBEC
DELAYED_RELEASE_TABLET | ORAL | Status: AC
  Filled 2021-10-03: qty 1

## 2021-10-03 MED ORDER — MEPERIDINE HCL 25 MG/ML IJ SOLN: 6.2500 mg | INTRAMUSCULAR | Status: DC | PRN

## 2021-10-03 MED ORDER — PANTOPRAZOLE SODIUM 40 MG PO TBEC
40.0000 mg | DELAYED_RELEASE_TABLET | Freq: Every day | ORAL | Status: DC
  Administered 2021-10-03: 40 mg via ORAL

## 2021-10-03 MED ORDER — ONDANSETRON HCL 4 MG/2ML IJ SOLN
INTRAMUSCULAR | Status: AC
  Filled 2021-10-03: qty 2

## 2021-10-03 MED ORDER — SUCCINYLCHOLINE CHLORIDE 200 MG/10ML IV SOSY
PREFILLED_SYRINGE | INTRAVENOUS | Status: AC
  Filled 2021-10-03: qty 10

## 2021-10-03 MED ORDER — HYDROMORPHONE HCL 1 MG/ML IJ SOLN
0.2500 mg | INTRAMUSCULAR | Status: DC | PRN
  Administered 2021-10-03 (×2): 0.5 mg via INTRAVENOUS

## 2021-10-03 MED ORDER — SCOPOLAMINE 1 MG/3DAYS TD PT72
1.0000 | MEDICATED_PATCH | TRANSDERMAL | Status: DC
  Administered 2021-10-03: 1.5 mg via TRANSDERMAL
  Filled 2021-10-03: qty 1

## 2021-10-03 MED ORDER — ROCURONIUM BROMIDE 10 MG/ML (PF) SYRINGE
PREFILLED_SYRINGE | INTRAVENOUS | Status: AC
  Filled 2021-10-03: qty 10

## 2021-10-03 MED ORDER — ORAL CARE MOUTH RINSE: 15.0000 mL | Freq: Once | OROMUCOSAL | Status: AC

## 2021-10-03 MED ORDER — KETOROLAC TROMETHAMINE 10 MG PO TABS: 10.0000 mg | ORAL_TABLET | Freq: Three times a day (TID) | ORAL | 0 refills | Status: AC | PRN

## 2021-10-03 MED ORDER — FENTANYL CITRATE (PF) 250 MCG/5ML IJ SOLN
INTRAMUSCULAR | Status: AC
  Filled 2021-10-03: qty 5

## 2021-10-03 MED ORDER — FAMOTIDINE IN NACL 20-0.9 MG/50ML-% IV SOLN
INTRAVENOUS | Status: DC | PRN
  Administered 2021-10-03: 20 mg via INTRAVENOUS

## 2021-10-03 MED ORDER — OXYCODONE HCL 5 MG/5ML PO SOLN: 5.0000 mg | Freq: Once | ORAL | Status: AC | PRN

## 2021-10-03 MED ORDER — ONDANSETRON HCL 4 MG/2ML IJ SOLN
4.0000 mg | Freq: Once | INTRAMUSCULAR | Status: AC | PRN
  Administered 2021-10-03: 4 mg via INTRAVENOUS

## 2021-10-03 MED ORDER — HYDROCODONE-ACETAMINOPHEN 5-325 MG PO TABS: 1.0000 | ORAL_TABLET | Freq: Four times a day (QID) | ORAL | 0 refills | Status: AC | PRN

## 2021-10-03 MED ORDER — LIDOCAINE 2% (20 MG/ML) 5 ML SYRINGE
INTRAMUSCULAR | Status: AC
  Filled 2021-10-03: qty 5

## 2021-10-03 MED ORDER — ACETAMINOPHEN 500 MG PO TABS
1000.0000 mg | ORAL_TABLET | Freq: Once | ORAL | Status: AC
Start: 2021-10-03 — End: 2021-10-03
  Administered 2021-10-03: 1000 mg via ORAL
  Filled 2021-10-03: qty 2

## 2021-10-03 MED ORDER — LACTATED RINGERS IV SOLN: INTRAVENOUS | Status: DC

## 2021-10-03 MED ORDER — KETOROLAC TROMETHAMINE 30 MG/ML IJ SOLN
30.0000 mg | Freq: Once | INTRAMUSCULAR | Status: AC
  Administered 2021-10-03: 30 mg via INTRAVENOUS
  Filled 2021-10-03 (×2): qty 1

## 2021-10-03 MED ORDER — DEXAMETHASONE SODIUM PHOSPHATE 10 MG/ML IJ SOLN
INTRAMUSCULAR | Status: AC
  Filled 2021-10-03: qty 1

## 2021-10-03 MED ORDER — MIDAZOLAM HCL 2 MG/2ML IJ SOLN
INTRAMUSCULAR | Status: AC
  Filled 2021-10-03: qty 2

## 2021-10-03 MED ORDER — DEXAMETHASONE SODIUM PHOSPHATE 10 MG/ML IJ SOLN
INTRAMUSCULAR | Status: DC | PRN
  Administered 2021-10-03: 8 mg via INTRAVENOUS

## 2021-10-03 MED ORDER — PROPOFOL 10 MG/ML IV BOLUS
INTRAVENOUS | Status: AC
  Filled 2021-10-03: qty 20

## 2021-10-03 MED ORDER — AMISULPRIDE (ANTIEMETIC) 5 MG/2ML IV SOLN: 10.0000 mg | Freq: Once | INTRAVENOUS | Status: DC | PRN

## 2021-10-03 MED ORDER — CEFAZOLIN SODIUM-DEXTROSE 2-4 GM/100ML-% IV SOLN
2.0000 g | INTRAVENOUS | Status: AC
  Administered 2021-10-03: 2 g via INTRAVENOUS
  Filled 2021-10-03: qty 100

## 2021-10-03 MED ORDER — PHENYLEPHRINE 80 MCG/ML (10ML) SYRINGE FOR IV PUSH (FOR BLOOD PRESSURE SUPPORT)
PREFILLED_SYRINGE | INTRAVENOUS | Status: DC | PRN
  Administered 2021-10-03: 240 ug via INTRAVENOUS
  Administered 2021-10-03: 80 ug via INTRAVENOUS

## 2021-10-03 MED ORDER — FAMOTIDINE IN NACL 20-0.9 MG/50ML-% IV SOLN: 20.0000 mg | Freq: Once | INTRAVENOUS | Status: DC

## 2021-10-03 MED ORDER — LIDOCAINE 2% (20 MG/ML) 5 ML SYRINGE
INTRAMUSCULAR | Status: DC | PRN
  Administered 2021-10-03: 60 mg via INTRAVENOUS

## 2021-10-03 MED ORDER — DEXMEDETOMIDINE HCL IN NACL 80 MCG/20ML IV SOLN
INTRAVENOUS | Status: DC | PRN
  Administered 2021-10-03: 12 ug via BUCCAL

## 2021-10-03 MED ORDER — BUPIVACAINE-EPINEPHRINE (PF) 0.5% -1:200000 IJ SOLN
INTRAMUSCULAR | Status: AC
  Filled 2021-10-03: qty 30

## 2021-10-03 MED ORDER — OXYCODONE HCL 5 MG PO TABS
ORAL_TABLET | ORAL | Status: AC
  Filled 2021-10-03: qty 1

## 2021-10-03 MED ORDER — LIDOCAINE HCL (PF) 1 % IJ SOLN
INTRAMUSCULAR | Status: AC
  Filled 2021-10-03: qty 30

## 2021-10-03 MED ORDER — SOD CITRATE-CITRIC ACID 500-334 MG/5ML PO SOLN
ORAL | Status: AC
  Filled 2021-10-03: qty 30

## 2021-10-03 MED ORDER — HYDROMORPHONE HCL 1 MG/ML IJ SOLN
INTRAMUSCULAR | Status: AC
  Filled 2021-10-03: qty 1

## 2021-10-03 MED ORDER — OXYCODONE HCL 5 MG PO TABS
5.0000 mg | ORAL_TABLET | Freq: Once | ORAL | Status: AC | PRN
  Administered 2021-10-03: 5 mg via ORAL

## 2021-10-03 MED ORDER — FENTANYL CITRATE (PF) 100 MCG/2ML IJ SOLN
INTRAMUSCULAR | Status: DC | PRN
  Administered 2021-10-03: 100 ug via INTRAVENOUS
  Administered 2021-10-03: 50 ug via INTRAVENOUS
  Administered 2021-10-03: 100 ug via INTRAVENOUS

## 2021-10-03 MED ORDER — SOD CITRATE-CITRIC ACID 500-334 MG/5ML PO SOLN
30.0000 mL | Freq: Once | ORAL | Status: AC
  Administered 2021-10-03: 30 mL via ORAL

## 2021-10-03 MED ORDER — CHLORHEXIDINE GLUCONATE 0.12 % MT SOLN
OROMUCOSAL | Status: AC
  Administered 2021-10-03: 15 mL via OROMUCOSAL
  Filled 2021-10-03: qty 15

## 2021-10-03 MED ORDER — FAMOTIDINE IN NACL 20-0.9 MG/50ML-% IV SOLN
INTRAVENOUS | Status: AC
  Filled 2021-10-03: qty 50

## 2021-10-03 MED ORDER — KETOROLAC TROMETHAMINE 30 MG/ML IJ SOLN: 30.0000 mg | Freq: Once | INTRAMUSCULAR | Status: DC | PRN

## 2021-10-03 MED ORDER — SODIUM CHLORIDE 0.9 % IR SOLN
Status: DC | PRN
  Administered 2021-10-03: 1000 mL

## 2021-10-03 MED ORDER — SUCCINYLCHOLINE CHLORIDE 200 MG/10ML IV SOSY
PREFILLED_SYRINGE | INTRAVENOUS | Status: DC | PRN
  Administered 2021-10-03: 100 mg via INTRAVENOUS

## 2021-10-03 MED ORDER — CHLORHEXIDINE GLUCONATE 0.12 % MT SOLN: 15.0000 mL | Freq: Once | OROMUCOSAL | Status: AC

## 2021-10-03 MED ORDER — ONDANSETRON 8 MG PO TBDP: 8.0000 mg | ORAL_TABLET | Freq: Three times a day (TID) | ORAL | 0 refills | Status: AC | PRN

## 2021-10-03 MED ORDER — POVIDONE-IODINE 10 % EX SWAB
2.0000 | Freq: Once | CUTANEOUS | Status: AC
  Administered 2021-10-03: 2 via TOPICAL

## 2021-10-03 SURGICAL SUPPLY — 30 items
BIPOLAR CUTTING LOOP 21FR (ELECTRODE)
CANISTER SUCT 3000ML PPV (MISCELLANEOUS) ×1 IMPLANT
CATH ROBINSON RED A/P 16FR (CATHETERS) ×1 IMPLANT
DRAPE STERI URO 9X17 APER PCH (DRAPES) ×1 IMPLANT
ELECT REM PT RETURN 9FT ADLT (ELECTROSURGICAL) ×1
ELECTRODE REM PT RTRN 9FT ADLT (ELECTROSURGICAL) ×1 IMPLANT
GAUZE PACKING 2X5 YD STRL (GAUZE/BANDAGES/DRESSINGS) IMPLANT
GLOVE BIO SURGEON STRL SZ7.5 (GLOVE) ×1 IMPLANT
GLOVE BIOGEL PI IND STRL 7.5 (GLOVE) ×1 IMPLANT
GLOVE BIOGEL PI IND STRL 8 (GLOVE) ×1 IMPLANT
GLOVE ECLIPSE 8.0 STRL XLNG CF (GLOVE) ×1 IMPLANT
GLOVE SURG UNDER POLY LF SZ7 (GLOVE) ×1 IMPLANT
GOWN STRL REUS W/ TWL LRG LVL3 (GOWN DISPOSABLE) ×4 IMPLANT
GOWN STRL REUS W/TWL LRG LVL3 (GOWN DISPOSABLE) ×4
KIT PROCEDURE FLUENT (KITS) ×1 IMPLANT
KIT TURNOVER KIT B (KITS) ×1 IMPLANT
LOOP CUTTING BIPOLAR 21FR (ELECTRODE) IMPLANT
NS IRRIG 1000ML POUR BTL (IV SOLUTION) ×1 IMPLANT
PACK VAGINAL MINOR WOMEN LF (CUSTOM PROCEDURE TRAY) ×1 IMPLANT
PACK VAGINAL WOMENS (CUSTOM PROCEDURE TRAY) ×1 IMPLANT
PAD ASTRINGENT PERI (MISCELLANEOUS) IMPLANT
PAD OB MATERNITY 4.3X12.25 (PERSONAL CARE ITEMS) ×1 IMPLANT
SPECIMEN JAR MEDIUM (MISCELLANEOUS) IMPLANT
SUT VIC AB 0 CT1 18XCR BRD8 (SUTURE) ×3 IMPLANT
SUT VIC AB 0 CT1 27 (SUTURE)
SUT VIC AB 0 CT1 27XCR 8 STRN (SUTURE) IMPLANT
SUT VIC AB 0 CT1 8-18 (SUTURE) ×3
TOWEL GREEN STERILE FF (TOWEL DISPOSABLE) ×2 IMPLANT
TRAY FOLEY W/BAG SLVR 14FR (SET/KITS/TRAYS/PACK) ×1 IMPLANT
UNDERPAD 30X36 HEAVY ABSORB (UNDERPADS AND DIAPERS) ×1 IMPLANT

## 2021-10-03 NOTE — Anesthesia Preprocedure Evaluation (Addendum)
Anesthesia Evaluation  Patient identified by MRN, date of birth, ID band Patient awake    Reviewed: Allergy & Precautions, NPO status , Patient's Chart, lab work & pertinent test results  Airway Mallampati: II  TM Distance: >3 FB Neck ROM: Full    Dental no notable dental hx. (+) Chipped,    Pulmonary former smoker,  Quit smoking 2013   Pulmonary exam normal breath sounds clear to auscultation       Cardiovascular negative cardio ROS Normal cardiovascular exam Rhythm:Regular Rate:Normal     Neuro/Psych PSYCHIATRIC DISORDERS Anxiety Depression negative neurological ROS     GI/Hepatic Neg liver ROS, GERD  Poorly Controlled,Currently has GERD in preop, did not take omeprazole this AM   Endo/Other  negative endocrine ROS  Renal/GU negative Renal ROS  negative genitourinary   Musculoskeletal negative musculoskeletal ROS (+)   Abdominal   Peds  Hematology  (+) Blood dyscrasia, anemia , Hb 9.5   Anesthesia Other Findings   Reproductive/Obstetrics Leiomyoma                             Anesthesia Physical Anesthesia Plan  ASA: 1  Anesthesia Plan: General   Post-op Pain Management: Tylenol PO (pre-op)* and Toradol IV (intra-op)*   Induction: Intravenous and Rapid sequence  PONV Risk Score and Plan: 4 or greater and Ondansetron, Dexamethasone, Midazolam, Scopolamine patch - Pre-op and Treatment may vary due to age or medical condition  Airway Management Planned: Oral ETT  Additional Equipment: None  Intra-op Plan:   Post-operative Plan: Extubation in OR  Informed Consent: I have reviewed the patients History and Physical, chart, labs and discussed the procedure including the risks, benefits and alternatives for the proposed anesthesia with the patient or authorized representative who has indicated his/her understanding and acceptance.     Dental advisory given  Plan Discussed with:  CRNA  Anesthesia Plan Comments:       Anesthesia Quick Evaluation

## 2021-10-03 NOTE — Anesthesia Postprocedure Evaluation (Signed)
Anesthesia Post Note  Patient: Wanda Nunez  Procedure(s) Performed: VAGINAL MYOMECTOMY     Patient location during evaluation: PACU Anesthesia Type: General Level of consciousness: awake and alert, oriented and patient cooperative Pain management: pain level controlled Vital Signs Assessment: post-procedure vital signs reviewed and stable Respiratory status: spontaneous breathing, nonlabored ventilation and respiratory function stable Cardiovascular status: blood pressure returned to baseline and stable Postop Assessment: no apparent nausea or vomiting Anesthetic complications: no   No notable events documented.  Last Vitals:  Vitals:   10/03/21 0723 10/03/21 1105  BP: 125/77 103/62  Pulse: 81 77  Resp: 20 15  Temp: 36.7 C 36.4 C  SpO2: 98% 95%    Last Pain:  Vitals:   10/03/21 1105  TempSrc:   PainSc: 0-No pain                 Pervis Hocking

## 2021-10-03 NOTE — Op Note (Signed)
Pre op Diagnosis:  Prolapsed submucosal myoma  Post op Diagnosis:  SAA  Procedure:  Myomectomy, vaginal approach  Surgeon:  Florian Buff, MD   Anesthesia: GET  Findings:  6 cm soft submucosal myoma, prolapsed through the cervix, no other abnormalities  Description of operation: Pt was taken to the [place] in dorsal lithotomy Prepped and draped in the usual fashion Weighted speculum place and Deaver retractors used for visualization Electrocautery was used and a central wedge Morcellation was performed and the mass was collapsed Curved Haney clamps were placed across the entire pedicle The myoma was removed intact in total 2 fore and aft sutures were placed around each Haney clamp with good hemostasis The pedicle was hemostatic The pedicle retracted into the uterine cavity I observed for several minutes and there was no bleeding Patient was awakened and taken to the PACU in good stable condition She received Ancef and toradol preoperatively prophylactically  EBL none  Florian Buff, MD 10/03/2021 11:00 AM

## 2021-10-03 NOTE — Transfer of Care (Signed)
Immediate Anesthesia Transfer of Care Note  Patient: Wanda Nunez  Procedure(s) Performed: VAGINAL MYOMECTOMY  Patient Location: PACU  Anesthesia Type:General  Level of Consciousness: awake and oriented  Airway & Oxygen Therapy: Patient Spontanous Breathing  Post-op Assessment: Report given to RN  Post vital signs: Reviewed and stable  Last Vitals:  Vitals Value Taken Time  BP 103/62   Temp    Pulse 77 10/03/21 1105  Resp 11 10/03/21 1105  SpO2 95 % 10/03/21 1105  Vitals shown include unvalidated device data.  Last Pain:  Vitals:   10/03/21 0736  TempSrc:   PainSc: 0-No pain         Complications: No notable events documented.

## 2021-10-03 NOTE — Anesthesia Procedure Notes (Signed)
Procedure Name: Intubation Date/Time: 10/03/2021 10:00 AM  Performed by: Barrington Ellison, CRNAPre-anesthesia Checklist: Patient identified, Emergency Drugs available, Suction available and Patient being monitored Patient Re-evaluated:Patient Re-evaluated prior to induction Oxygen Delivery Method: Circle System Utilized Preoxygenation: Pre-oxygenation with 100% oxygen Induction Type: IV induction, Rapid sequence and Cricoid Pressure applied Laryngoscope Size: Mac and 3 Grade View: Grade I Tube type: Oral Tube size: 7.0 mm Number of attempts: 1 Airway Equipment and Method: Stylet and Oral airway Placement Confirmation: ETT inserted through vocal cords under direct vision, positive ETCO2 and breath sounds checked- equal and bilateral Secured at: 21 cm Tube secured with: Tape Dental Injury: Teeth and Oropharynx as per pre-operative assessment

## 2021-10-03 NOTE — H&P (Signed)
Preoperative History and Physical  Wanda Nunez is a 36 y.o. [id number] with Patient's last menstrual period was 08/12/2021 (exact date). admitted for a removal of a prolapsed submucosal myoma.  Has had anemia issues which have delayed the surgery  PMH:    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/2017   Anxiety    Depression    GERD (gastroesophageal reflux disease)    Wears glasses     PSH:     Past Surgical History:  Procedure Laterality Date   CESAREAN SECTION     x2   (5 yrs ago and 10 yrs ago)    POb/GynH:      OB History     Gravida  2   Para  2   Term  2   Preterm  0   AB  0   Living  2      SAB  0   IAB  0   Ectopic  0   Multiple  0   Live Births  2           SH:   Social History   Tobacco Use   Smoking status: Former    Types: Cigarettes    Quit date: 2013    Years since quitting: 10.7   Smokeless tobacco: Never   Tobacco comments:    Social smoking for a few months 2013  Vaping Use   Vaping Use: Never used  Substance Use Topics   Alcohol use: No   Drug use: No    FH:    Family History  Problem Relation Age of Onset   Diabetes Mother    Hypertension Mother    Bladder Cancer Mother    Healthy Father    Hypertension Brother    Vasculitis Brother    Hypertension Maternal Grandmother    Peripheral vascular disease Maternal Grandmother    Stroke Maternal Grandfather    CAD Neg Hx    CVA Neg Hx    Cancer Neg Hx      Allergies: No Known Allergies  Medications:       Current Facility-Administered Medications:    ceFAZolin (ANCEF) IVPB 2g/100 mL premix, 2 g, Intravenous, On Call to OR, Florian Buff, MD   famotidine (PEPCID) 20-0.9 MG/50ML-% IVPB, , , ,    ketorolac (TORADOL) 30 MG/ML injection 30 mg, 30 mg, Intravenous, Once, Jaleena Viviani, Mertie Clause, MD   lactated ringers infusion, , Intravenous, Continuous, Nunzio Cobbs M, DO, Last Rate: 10 mL/hr at 10/03/21 0757, New Bag at 10/03/21 0757    pantoprazole (PROTONIX) 40 MG EC tablet, , , ,    scopolamine (TRANSDERM-SCOP) 1 MG/3DAYS 1.5 mg, 1 patch, Transdermal, Q72H, Finucane, Elizabeth M, DO, 1.5 mg at 10/03/21 0756   sodium citrate-citric acid (ORACIT) 500-334 MG/5ML solution, , , ,   Review of Systems:   Review of Systems  Constitutional: Negative for fever, chills, weight loss, malaise/fatigue and diaphoresis.  HENT: Negative for hearing loss, ear pain, nosebleeds, congestion, sore throat, neck pain, tinnitus and ear discharge.   Eyes: Negative for blurred vision, double vision, photophobia, pain, discharge and redness.  Respiratory: Negative for cough, hemoptysis, sputum production, shortness of breath, wheezing and stridor.   Cardiovascular: Negative for chest pain, palpitations, orthopnea, claudication, leg swelling and PND.  Gastrointestinal: Positive for abdominal pain. Negative for heartburn, nausea, vomiting, diarrhea, constipation, blood in stool and melena.  Genitourinary: Negative for dysuria, urgency, frequency, hematuria and flank pain.  Musculoskeletal: Negative for myalgias, back pain, joint pain and falls.  Skin: Negative for itching and rash.  Neurological: Negative for dizziness, tingling, tremors, sensory change, speech change, focal weakness, seizures, loss of consciousness, weakness and headaches.  Endo/Heme/Allergies: Negative for environmental allergies and polydipsia. Does not bruise/bleed easily.  Psychiatric/Behavioral: Negative for depression, suicidal ideas, hallucinations, memory loss and substance abuse. The patient is not nervous/anxious and does not have insomnia.      PHYSICAL EXAM:  Blood pressure 125/77, pulse 81, temperature 98 F (36.7 C), temperature source Oral, resp. rate 20, height '5\' 6"'$  (1.676 m), weight 71.2 kg, last menstrual period 08/12/2021, SpO2 98 %.    Vitals reviewed. Constitutional: She is oriented to person, place, and time. She appears well-developed and well-nourished.   HENT:  Head: Normocephalic and atraumatic.  Right Ear: External ear normal.  Left Ear: External ear normal.  Nose: Nose normal.  Mouth/Throat: Oropharynx is clear and moist.  Eyes: Conjunctivae and EOM are normal. Pupils are equal, round, and reactive to light. Right eye exhibits no discharge. Left eye exhibits no discharge. No scleral icterus.  Neck: Normal range of motion. Neck supple. No tracheal deviation present. No thyromegaly present.  Cardiovascular: Normal rate, regular rhythm, normal heart sounds and intact distal pulses.  Exam reveals no gallop and no friction rub.   No murmur heard. Respiratory: Effort normal and breath sounds normal. No respiratory distress. She has no wheezes. She has no rales. She exhibits no tenderness.  GI: Soft. Bowel sounds are normal. She exhibits no distension and no mass. There is tenderness. There is no rebound and no guarding.  Genitourinary:       Vulva is normal without lesions Vagina is pink moist without discharge Cervix normal in appearance and pap is normal, 5 cm p[rolapsed myoma Uterus is normal size, contour, position, consistency, mobility, non-tender Adnexa is negative with normal sized ovaries by sonogram  Musculoskeletal: Normal range of motion. She exhibits no edema and no tenderness.  Neurological: She is alert and oriented to person, place, and time. She has normal reflexes. She displays normal reflexes. No cranial nerve deficit. She exhibits normal muscle tone. Coordination normal.  Skin: Skin is warm and dry. No rash noted. No erythema. No pallor.  Psychiatric: She has a normal mood and affect. Her behavior is normal. Judgment and thought content normal.    Labs: Results for orders placed or performed during the hospital encounter of 10/03/21 (from the past 336 hour(s))  Type and screen   Collection Time: 10/03/21  7:45 AM  Result Value Ref Range   ABO/RH(D) PENDING    Antibody Screen PENDING    Sample Expiration       10/06/2021,2359 Performed at [facility], [place] [address] [place], [place] [address]   CBC   Collection Time: 10/03/21  8:01 AM  Result Value Ref Range   WBC 9.5 4.0 - 10.5 K/uL   RBC 5.01 3.87 - 5.11 MIL/uL   Hemoglobin 9.5 (L) 12.0 - 15.0 g/dL   HCT 32.9 (L) 36.0 - 46.0 %   MCV 65.7 (L) 80.0 - 100.0 fL   MCH 19.0 (L) 26.0 - 34.0 pg   MCHC 28.9 (L) 30.0 - 36.0 g/dL   RDW 26.2 (H) 11.5 - 15.5 %   Platelets 297 150 - 400 K/uL   nRBC 0.0 0.0 - 0.2 %    EKG: No orders found for this or any previous visit.  Imaging Studies: No results found.    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

## 2021-10-04 LAB — SURGICAL PATHOLOGY

## 2021-10-06 ENCOUNTER — Encounter (HOSPITAL_COMMUNITY): Payer: Self-pay | Admitting: Obstetrics & Gynecology

## 2021-10-20 ENCOUNTER — Ambulatory Visit (INDEPENDENT_AMBULATORY_CARE_PROVIDER_SITE_OTHER): Payer: Medicaid Other | Admitting: Podiatry

## 2021-10-20 DIAGNOSIS — B353 Tinea pedis: Secondary | ICD-10-CM

## 2021-10-20 DIAGNOSIS — B351 Tinea unguium: Secondary | ICD-10-CM | POA: Diagnosis not present

## 2021-10-20 DIAGNOSIS — L603 Nail dystrophy: Secondary | ICD-10-CM

## 2021-10-20 DIAGNOSIS — Z79899 Other long term (current) drug therapy: Secondary | ICD-10-CM

## 2021-10-20 MED ORDER — CLOTRIMAZOLE-BETAMETHASONE 1-0.05 % EX CREA: 1.0000 | TOPICAL_CREAM | Freq: Two times a day (BID) | CUTANEOUS | 0 refills | Status: AC

## 2021-10-20 MED ORDER — TERBINAFINE HCL 250 MG PO TABS: 250.0000 mg | ORAL_TABLET | Freq: Every day | ORAL | 2 refills | Status: AC

## 2021-10-20 NOTE — Progress Notes (Signed)
Subjective:  Patient ID: Wanda Nunez, female    DOB: 24-Jul-1985,  MRN: [id number]  Chief Complaint  Patient presents with   Nail Problem    36 y.o. female presents with the above complaint.  Patient presents with follow-up to right hallux onychodystrophy/nail dystrophy.  Patient states that she never did Lamisil therapy.  She would like to do Lamisil therapy now.  She just had outside blood work done which seems to be within normal limits.  She would also like to have something for athlete's foot as well.  Is been causing her some itching and some discomfort.  Review of Systems: Negative except as noted in the HPI. Denies N/V/F/Ch.  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/2017   Anxiety    Depression    GERD (gastroesophageal reflux disease)    Wears glasses     Current Outpatient Medications:    clotrimazole-betamethasone (LOTRISONE) cream, Apply 1 Application topically 2 (two) times daily., Disp: 30 g, Rfl: 0   terbinafine (LAMISIL) 250 MG tablet, Take 1 tablet (250 mg total) by mouth daily., Disp: 30 tablet, Rfl: 2   HYDROcodone-acetaminophen (NORCO/VICODIN) 5-325 MG tablet, Take 1 tablet by mouth every 6 (six) hours as needed., Disp: 15 tablet, Rfl: 0   ketorolac (TORADOL) 10 MG tablet, Take 1 tablet (10 mg total) by mouth every 8 (eight) hours as needed., Disp: 15 tablet, Rfl: 0   Multiple Vitamins-Minerals (MULTIVITAMIN GUMMIES ADULT) CHEW, Chew 2 each by mouth daily., Disp: , Rfl:    ondansetron (ZOFRAN-ODT) 8 MG disintegrating tablet, Take 1 tablet (8 mg total) by mouth every 8 (eight) hours as needed for nausea or vomiting., Disp: 8 tablet, Rfl: 0  Social History   Tobacco Use  Smoking Status Former   Types: Cigarettes   Quit date: 2013   Years since quitting: 10.7  Smokeless Tobacco Never  Tobacco Comments   Social smoking for a few months 2013    No Known Allergies Objective:  There were no vitals filed for this visit. There is no  height or weight on file to calculate BMI. Constitutional Well developed. Well nourished.  Vascular Dorsalis pedis pulses palpable bilaterally. Posterior tibial pulses palpable bilaterally. Capillary refill normal to all digits.  No cyanosis or clubbing noted. Pedal hair growth normal.  Neurologic Normal speech. Oriented to person, place, and time. Epicritic sensation to light touch grossly present bilaterally.  Dermatologic Nails thickened elongated dystrophic mycotic toenails x1 right hallux.  Mild pain on palpation Epi dermal lysis is noted to the plantar foot.  Subjective component of itching noted.  Orthopedic: Normal joint ROM without pain or crepitus bilaterally. No visible deformities. No bony tenderness.   Radiographs: None Assessment:   1. Long-term use of high-risk medication   2. Onychomycosis due to dermatophyte   3. Nail dystrophy   4. Tinea pedis of both feet     Plan:  Patient was evaluated and treated and all questions answered.  Right hallux onychomycosis -Educated the patient on the etiology of onychomycosis and various treatment options associated with improving the fungal load.  I explained to the patient that there is 3 treatment options available to treat the onychomycosis including topical, p.o., laser treatment.  Patient elected to undergo p.o. options with Lamisil/terbinafine therapy.  In order for me to start the medication therapy, I explained to the patient the importance of evaluating the liver and obtaining the liver function test.  Once the liver function test comes back  normal I will start him on 18-monthcourse of Lamisil therapy.  Patient understood all risk and would like to proceed with Lamisil therapy.  I have asked the patient to immediately stop the Lamisil therapy if she has any reactions to it and call the office or go to the emergency room right away.  Patient states understanding  Bilateral athlete's foot -I explained to the patient the  etiology of athlete's foot and worse treatment options were discussed.  I believe she will benefit from Lotrisone cream.  Lotrisone cream was sent to the pharmacy of asked her to apply twice a day.  She states understanding     No follow-ups on file.   No follow-ups on file.

## 2022-02-04 ENCOUNTER — Other Ambulatory Visit: Payer: Self-pay

## 2022-02-04 ENCOUNTER — Emergency Department (HOSPITAL_COMMUNITY): Payer: Medicaid Other

## 2022-02-04 ENCOUNTER — Emergency Department (HOSPITAL_COMMUNITY)
Admission: EM | Admit: 2022-02-04 | Discharge: 2022-02-05 | Disposition: A | Discharge status: Home / Self Care | Payer: Medicaid Other | Attending: Emergency Medicine | Admitting: Emergency Medicine

## 2022-02-04 DIAGNOSIS — W19XXXA Unspecified fall, initial encounter: Secondary | ICD-10-CM

## 2022-02-04 DIAGNOSIS — W109XXA Fall (on) (from) unspecified stairs and steps, initial encounter: Secondary | ICD-10-CM | POA: Insufficient documentation

## 2022-02-04 DIAGNOSIS — M79672 Pain in left foot: Secondary | ICD-10-CM | POA: Insufficient documentation

## 2022-02-04 MED ORDER — ACETAMINOPHEN 500 MG PO TABS
1000.0000 mg | ORAL_TABLET | Freq: Once | ORAL | Status: AC
  Administered 2022-02-04: 1000 mg via ORAL
  Filled 2022-02-04: qty 2

## 2022-02-04 NOTE — ED Triage Notes (Signed)
Pt arrives with c/o left foot pain after having a fall down 2-3 steps. There is obvious swelling to left foot.

## 2022-02-05 ENCOUNTER — Encounter (HOSPITAL_COMMUNITY): Payer: Self-pay | Admitting: Emergency Medicine

## 2022-02-05 MED ORDER — NAPROXEN 500 MG PO TABS: 500.0000 mg | ORAL_TABLET | Freq: Two times a day (BID) | ORAL | 0 refills | Status: AC

## 2022-02-05 NOTE — ED Provider Notes (Signed)
[facility] EMERGENCY DEPARTMENT Provider Note   CSN: [id number] Arrival date & time: 02/04/22  1938     History  Chief Complaint  Patient presents with   Foot Pain    Wanda Nunez is a 37 y.o. female.  The history is provided by the patient and medical records.  Foot Pain    37 y.o. F presenting to the ED for left foot pain.  Tripped over her sons shoe while going down the stairs, twisted left foot.  No head injury or LOC.  Swelling started right away, not really able to ambulate or bear weight due to pain.  Denies numbness/tingling.  No meds PTA.  Home Medications Prior to Admission medications   Medication Sig Start Date End Date Taking? Authorizing Provider  naproxen (NAPROSYN) 500 MG tablet Take 1 tablet (500 mg total) by mouth 2 (two) times daily. 02/05/22  Yes Larene Pickett, PA-C  clotrimazole-betamethasone (LOTRISONE) cream Apply 1 Application topically 2 (two) times daily. 10/20/21   Felipa Furnace, DPM  HYDROcodone-acetaminophen (NORCO/VICODIN) 5-325 MG tablet Take 1 tablet by mouth every 6 (six) hours as needed. 10/03/21   Florian Buff, MD  ketorolac (TORADOL) 10 MG tablet Take 1 tablet (10 mg total) by mouth every 8 (eight) hours as needed. 10/03/21   Florian Buff, MD  Multiple Vitamins-Minerals (MULTIVITAMIN GUMMIES ADULT) CHEW Chew 2 each by mouth daily.    [provider]  ondansetron (ZOFRAN-ODT) 8 MG disintegrating tablet Take 1 tablet (8 mg total) by mouth every 8 (eight) hours as needed for nausea or vomiting. 10/03/21   Florian Buff, MD      Allergies    Patient has no known allergies.    Review of Systems   Review of Systems  Musculoskeletal:  Positive for arthralgias.  All other systems reviewed and are negative.   Physical Exam Updated Vital Signs BP (!) 141/94   Pulse 93   Temp 98.2 F (36.8 C) (Oral)   Resp 18   Ht '5\' 6"'$  (1.676 m)   Wt 71.2 kg   SpO2 100%   BMI 25.34 kg/m  Physical Exam Vitals and  nursing note reviewed.  Constitutional:      Appearance: She is well-developed.  HENT:     Head: Normocephalic and atraumatic.  Eyes:     Conjunctiva/sclera: Conjunctivae normal.     Pupils: Pupils are equal, round, and reactive to light.  Cardiovascular:     Rate and Rhythm: Normal rate and regular rhythm.     Heart sounds: Normal heart sounds.  Pulmonary:     Effort: Pulmonary effort is normal.     Breath sounds: Normal breath sounds.  Abdominal:     General: Bowel sounds are normal.     Palpations: Abdomen is soft.  Musculoskeletal:        General: Normal range of motion.     Cervical back: Normal range of motion.     Comments: Swelling left lateral dorsal foot, some early bruising developing, DP pulse intact, normal sensation distally, moving toes on command Proximal tib/fib and ankle non-tender  Skin:    General: Skin is warm and dry.  Neurological:     Mental Status: She is alert and oriented to person, place, and time.     ED Results / Procedures / Treatments   Labs (all labs ordered are listed, but only abnormal results are displayed) Labs Reviewed - No data to display  EKG None  Radiology DG Foot  Complete Left  Result Date: 02/04/2022 CLINICAL DATA:  Fall down stairs, twisting injury EXAM: LEFT FOOT - COMPLETE 3+ VIEW COMPARISON:  None Available. FINDINGS: There is no evidence of fracture or dislocation. There is no evidence of arthropathy or other focal bone abnormality. Soft tissues are unremarkable. IMPRESSION: Negative. Electronically Signed   By: Rolm Baptise M.D.   On: 02/04/2022 21:10    Procedures Procedures    Medications Ordered in ED Medications  acetaminophen (TYLENOL) tablet 1,000 mg (1,000 mg Oral Given 02/04/22 2115)    ED Course/ Medical Decision Making/ A&P                             Medical Decision Making Risk OTC drugs. Prescription drug management.   37 year old female here with left foot pain after she tripped over her son's  shoe going down the stairs.  There was no head injury or loss of consciousness.  Had immediate onset of pain and swelling to left dorsal foot.  No deformity noted on exam.  Foot is neurovascular intact.  X-ray is negative.  Suspect sprain type injury.  Reports she cannot bear weight or ambulate, placed in ASO given crutches, advised to progress back to weightbearing as tolerated.  Encouraged to ice and elevate, NSAIDs.  Given orthopedic follow-up if not improving.  Can return here for new concerns.  Final Clinical Impression(s) / ED Diagnoses Final diagnoses:  Left foot pain  Fall, initial encounter    Rx / DC Orders ED Discharge Orders          Ordered    naproxen (NAPROSYN) 500 MG tablet  2 times daily        02/05/22 0052              Larene Pickett, PA-C 02/05/22 0115    Quintella Reichert, MD 02/05/22 [id number]

## 2022-02-05 NOTE — Progress Notes (Signed)
Orthopedic Tech Progress Note Patient Details:  Wanda Nunez March 03, 1985 [id number]  Ortho Devices Type of Ortho Device: Crutches, ASO Ortho Device/Splint Location: lle Ortho Device/Splint Interventions: Ordered, Application, Adjustment   Post Interventions Patient Tolerated: Well Instructions Provided: Care of device, Adjustment of device  [name] 02/05/2022, 2:30 AM

## 2022-02-05 NOTE — Discharge Instructions (Signed)
Take the prescribed medication as directed.  Ice and elevate foot at home to help with pain/swelling. Wear brace and use crutches for now, progress back to weight bearing as tolerated. Follow-up with Dr. Lyla Glassing if ongoing issues or not improving. Return to the ED for new or worsening symptoms.

## 2022-02-23 ENCOUNTER — Ambulatory Visit: Payer: Medicaid Other | Admitting: Podiatry

## 2023-08-12 IMAGING — CT CT ABD-PELV W/ CM
2 of 4 series · 16 of 46 positions shown, 18 images · IV contrast (APPLIED)
Comparison: CT 06/18/2019

CLINICAL DATA: Pelvic pain

EXAM:
CT ABDOMEN AND PELVIS WITH CONTRAST
TECHNIQUE: Multidetector CT imaging of the abdomen and pelvis was performed
using the standard protocol following bolus administration of
intravenous contrast.

[Series 3: abdomen 5.0 · axial · 0.96mm/px · z∈[+486,+881]mm · 13 of 89 slices shown, 15 images]
[im 5/89  soft-tissue]
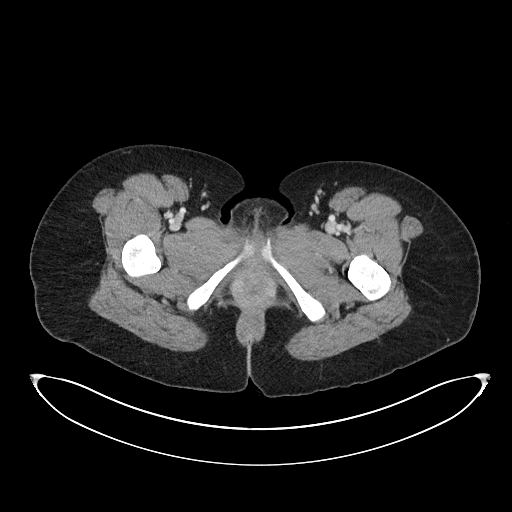
[im 5/89  bone]
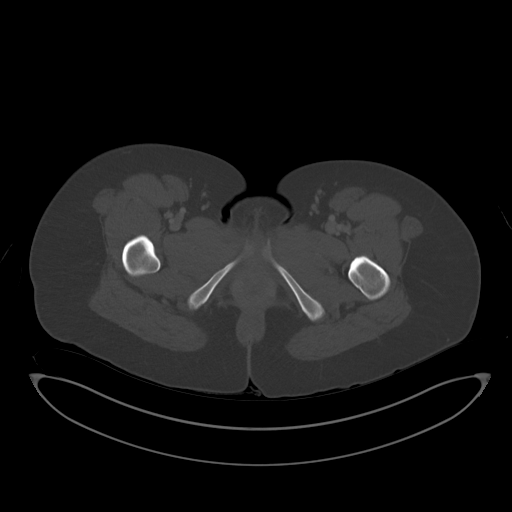
[im 14/89  soft-tissue]
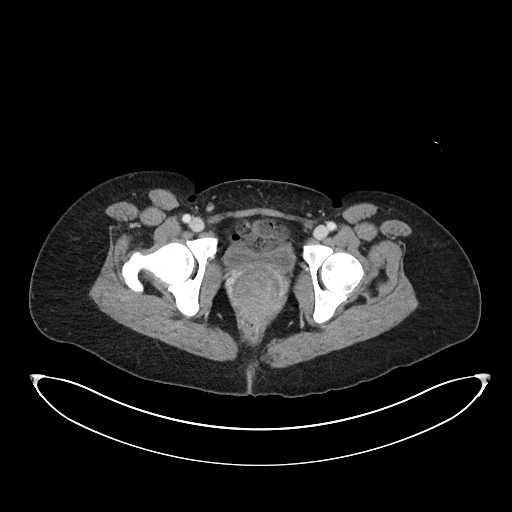
[im 18/89  soft-tissue]
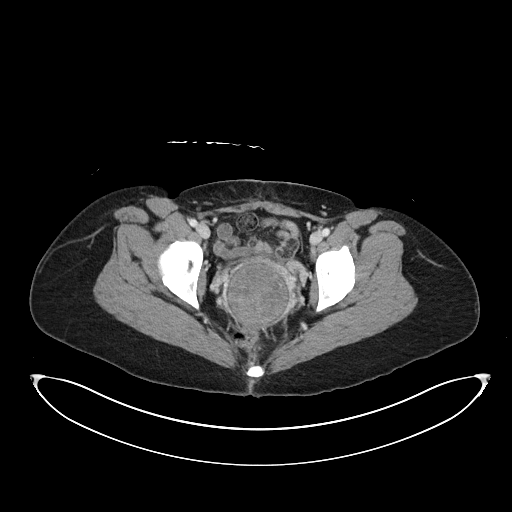
[im 27/89  soft-tissue]
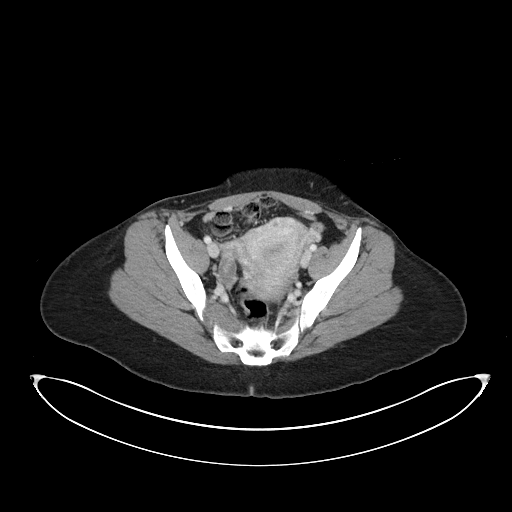
[im 31/89  soft-tissue]
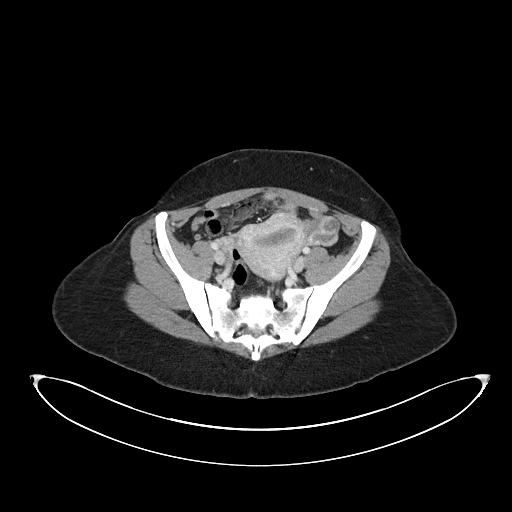
[im 40/89  soft-tissue]
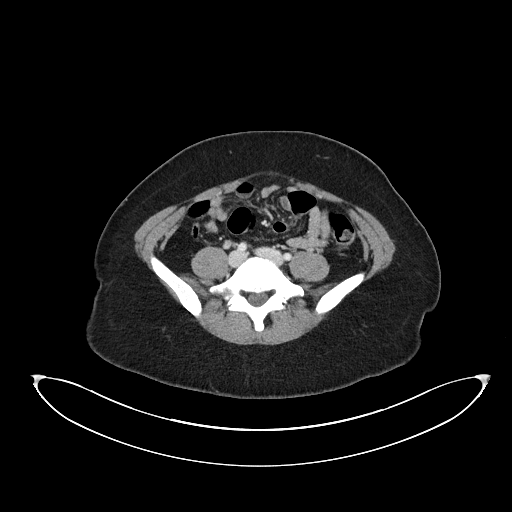
[im 45/89  soft-tissue]
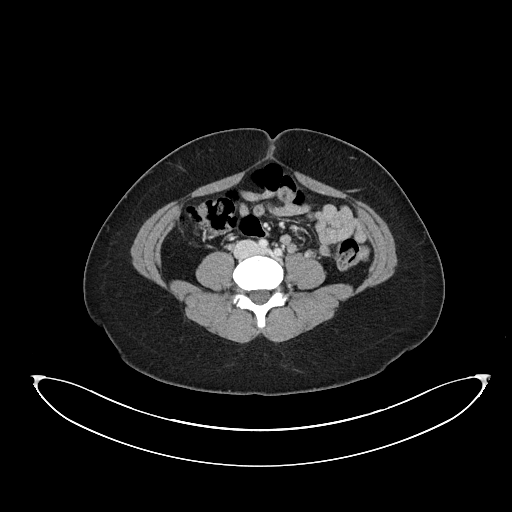
[im 49/89  soft-tissue]
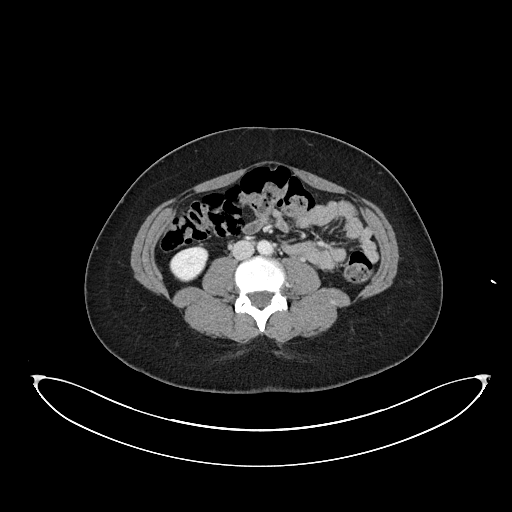
[im 58/89  soft-tissue]
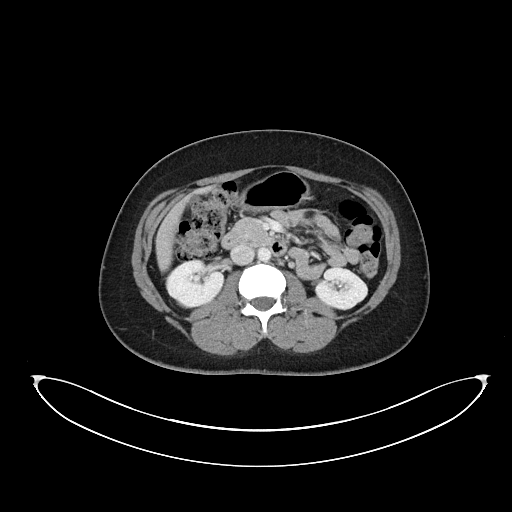
[im 58/89  bone]
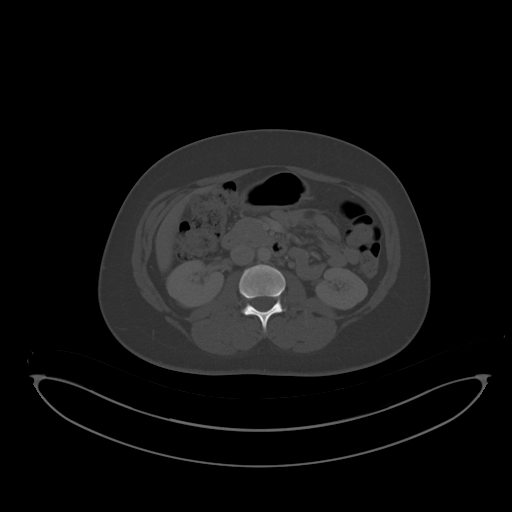
[im 62/89  soft-tissue]
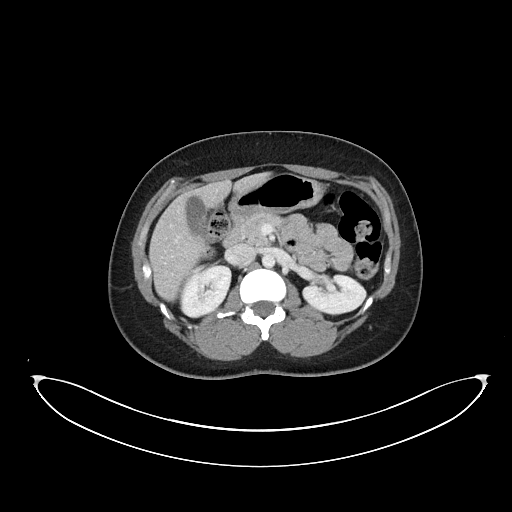
[im 71/89  soft-tissue]
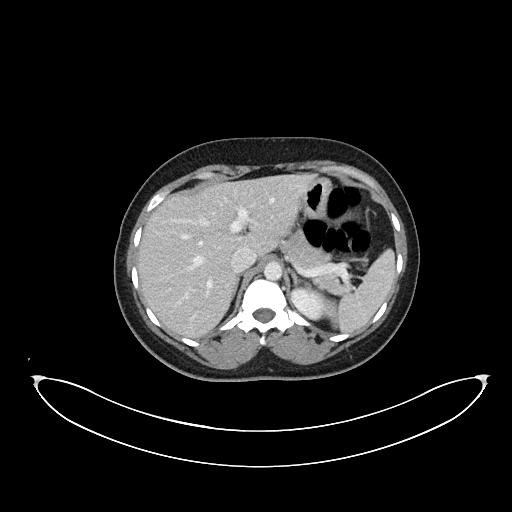
[im 75/89  soft-tissue]
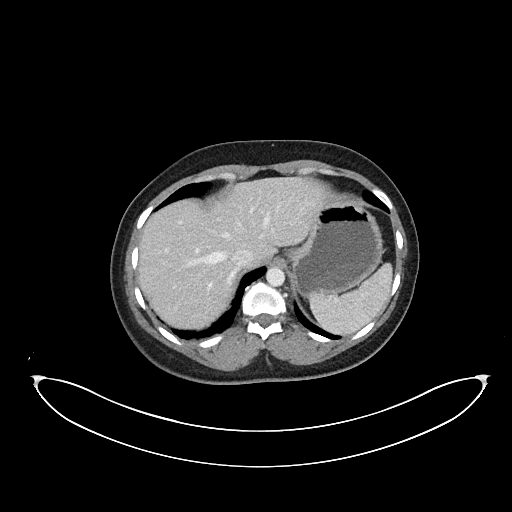
[im 84/89  soft-tissue]
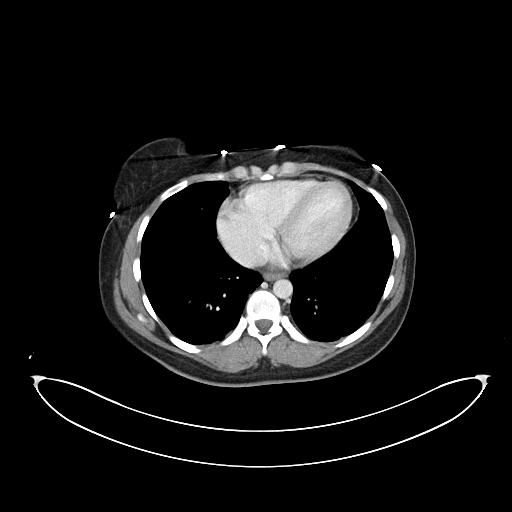

[Series 6: abdomen 3.0 mpr cor · coronal · 0.85mm/px · 3 of 92 slices shown]
[im 31/92  soft-tissue]
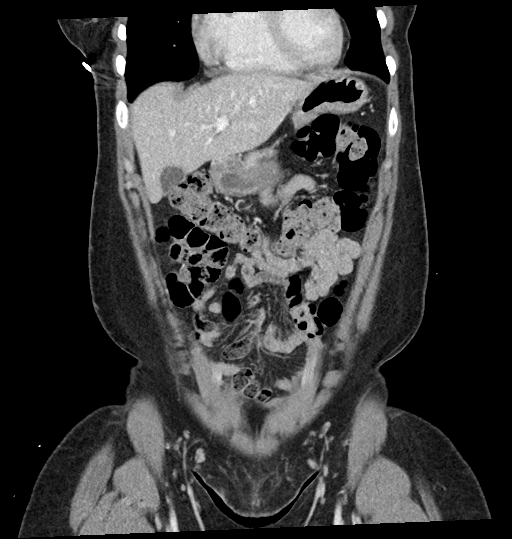
[im 41/92  soft-tissue]
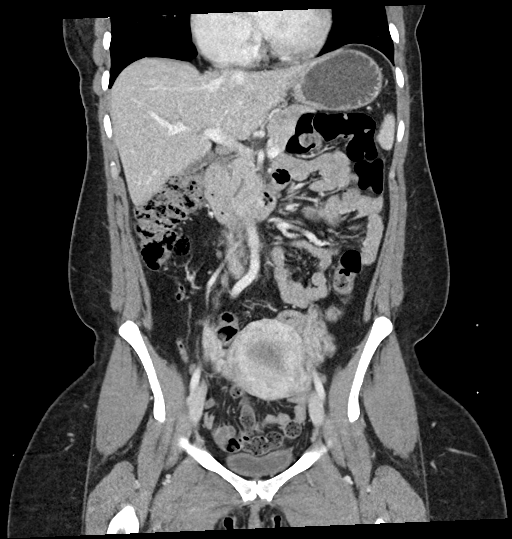
[im 51/92  soft-tissue]
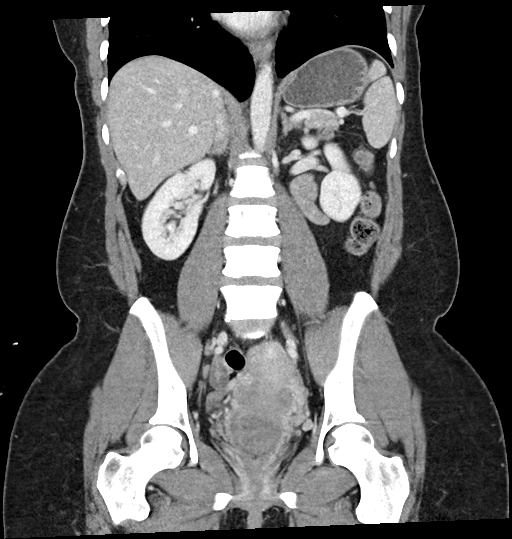

[16 of 46 positions shown; findings below may reference images not displayed]

RADIATION DOSE REDUCTION: This exam was performed according to the
departmental dose-optimization program which includes automated
exposure control, adjustment of the mA and/or kV according to
patient size and/or use of iterative reconstruction technique.

CONTRAST:  100mL OMNIPAQUE IOHEXOL 300 MG/ML  SOLN
FINDINGS: Lower chest: Lung bases demonstrate no acute consolidation or
effusion. Normal cardiac size.

Hepatobiliary: No focal liver abnormality is seen. No gallstones,
gallbladder wall thickening, or biliary dilatation.

Pancreas: Unremarkable. No pancreatic ductal dilatation or
surrounding inflammatory changes.

Spleen: Normal in size without focal abnormality.

Adrenals/Urinary Tract: Adrenal glands are unremarkable. Kidneys are
normal, without renal calculi, focal lesion, or hydronephrosis.
Bladder is unremarkable.

Stomach/Bowel: Stomach is within normal limits. Appendix appears
normal. No evidence of bowel wall thickening, distention, or
inflammatory changes.

Vascular/Lymphatic: No significant vascular findings are present. No
enlarged abdominal or pelvic lymph nodes.

Reproductive: Few small uterine fibroids. Large solid appearing
cervical mass measuring 5.7 by 6 by 6.4 cm. No suspicious adnexal
mass.

Other: Negative for pelvic effusion or free air.

Musculoskeletal: No acute or significant osseous findings.
IMPRESSION: 1. Large solid 6.4 cm mass within the cervix. Differential
considerations include prolapsed submucosal fibroid versus
endometrial or cervical mass. Gynecology consultation recommended as
is direct visualization.

## 2024-02-03 ENCOUNTER — Encounter
